# Patient Record
Sex: Female | Born: 1955 | Race: Black or African American | Hispanic: No | Smoking: Current every day smoker
Health system: Southern US, Community
[De-identification: ages and names within clinical notes are randomized; demographics above are authoritative.]

## PROBLEM LIST (undated history)

## (undated) DIAGNOSIS — I10 Essential (primary) hypertension: Secondary | ICD-10-CM

## (undated) DIAGNOSIS — K219 Gastro-esophageal reflux disease without esophagitis: Secondary | ICD-10-CM

## (undated) DIAGNOSIS — E785 Hyperlipidemia, unspecified: Secondary | ICD-10-CM

## (undated) DIAGNOSIS — J45909 Unspecified asthma, uncomplicated: Secondary | ICD-10-CM

## (undated) DIAGNOSIS — R7303 Prediabetes: Secondary | ICD-10-CM

## (undated) DIAGNOSIS — G4700 Insomnia, unspecified: Secondary | ICD-10-CM

## (undated) HISTORY — DX: Hyperlipidemia, unspecified: E78.5

## (undated) HISTORY — DX: Insomnia, unspecified: G47.00

## (undated) HISTORY — DX: Essential (primary) hypertension: I10

---

## 1979-02-13 HISTORY — PX: OTHER SURGICAL HISTORY: SHX169

## 2004-05-19 ENCOUNTER — Emergency Department (HOSPITAL_COMMUNITY): Admission: EM | Admit: 2004-05-19 | Discharge: 2004-05-19 | Payer: Self-pay | Admitting: Emergency Medicine

## 2005-12-06 ENCOUNTER — Emergency Department (HOSPITAL_COMMUNITY): Admission: EM | Admit: 2005-12-06 | Discharge: 2005-12-06 | Payer: Self-pay | Admitting: Emergency Medicine

## 2005-12-07 ENCOUNTER — Emergency Department (HOSPITAL_COMMUNITY): Admission: EM | Admit: 2005-12-07 | Discharge: 2005-12-07 | Payer: Self-pay | Admitting: Emergency Medicine

## 2009-04-10 ENCOUNTER — Emergency Department (HOSPITAL_COMMUNITY): Admission: EM | Admit: 2009-04-10 | Discharge: 2009-04-10 | Payer: Self-pay | Admitting: Emergency Medicine

## 2009-09-22 ENCOUNTER — Emergency Department (HOSPITAL_COMMUNITY): Admission: EM | Admit: 2009-09-22 | Discharge: 2009-09-22 | Payer: Self-pay | Admitting: Emergency Medicine

## 2011-09-19 ENCOUNTER — Telehealth: Payer: Self-pay

## 2019-02-23 ENCOUNTER — Other Ambulatory Visit: Payer: Self-pay

## 2019-02-23 ENCOUNTER — Emergency Department (HOSPITAL_COMMUNITY): Payer: Self-pay

## 2019-02-23 ENCOUNTER — Emergency Department (HOSPITAL_COMMUNITY)
Admission: EM | Admit: 2019-02-23 | Discharge: 2019-02-23 | Disposition: A | Discharge status: Home / Self Care | Payer: Self-pay | Attending: Emergency Medicine | Admitting: Emergency Medicine

## 2019-02-23 ENCOUNTER — Encounter (HOSPITAL_COMMUNITY): Payer: Self-pay | Admitting: *Deleted

## 2019-02-23 DIAGNOSIS — Y999 Unspecified external cause status: Secondary | ICD-10-CM | POA: Insufficient documentation

## 2019-02-23 DIAGNOSIS — F17210 Nicotine dependence, cigarettes, uncomplicated: Secondary | ICD-10-CM | POA: Insufficient documentation

## 2019-02-23 DIAGNOSIS — S42031A Displaced fracture of lateral end of right clavicle, initial encounter for closed fracture: Secondary | ICD-10-CM | POA: Insufficient documentation

## 2019-02-23 DIAGNOSIS — J45909 Unspecified asthma, uncomplicated: Secondary | ICD-10-CM | POA: Insufficient documentation

## 2019-02-23 DIAGNOSIS — Y939 Activity, unspecified: Secondary | ICD-10-CM | POA: Insufficient documentation

## 2019-02-23 DIAGNOSIS — Y929 Unspecified place or not applicable: Secondary | ICD-10-CM | POA: Insufficient documentation

## 2019-02-23 DIAGNOSIS — W19XXXA Unspecified fall, initial encounter: Secondary | ICD-10-CM | POA: Insufficient documentation

## 2019-02-23 HISTORY — DX: Unspecified asthma, uncomplicated: J45.909

## 2019-02-23 MED ORDER — ACETAMINOPHEN 500 MG PO TABS
1000.0000 mg | ORAL_TABLET | Freq: Once | ORAL | Status: AC
  Administered 2019-02-23: 1000 mg via ORAL
  Filled 2019-02-23: qty 2

## 2019-02-23 NOTE — ED Provider Notes (Signed)
[facility] EMERGENCY DEPARTMENT Provider Note   CSN: [id number] Arrival date & time: 02/23/19  1022     History Chief Complaint  Kristine [name] presents with  . Shoulder Pain    TAFFIE [name] is a 64 y.o. female.  Kristine [name] is a 64 y.o. female with history of asthma, who presents to the ED for evaluation of right shoulder pain.  Kristine [name] reports pain began on New Year's Eve after Kristine [name] had a little bit too much to drink and fell landing on her right shoulder.  Kristine [name] reports pain to the bony prominence over the [facility] joint.  Kristine [name] reports initially pain was more severe and now it is more mild and comes and goes.  Kristine [name] has been able to manage it with Tylenol and Aspercreme but since pain continued Kristine [name] came in for evaluation.  Kristine [name] has not noted any swelling or discoloration.  Kristine [name] denies numbness tingling or weakness in the right arm.  Kristine [name] denies any other injuries from the fall.  No previous surgery or injury to the right shoulder.  No other aggravating relieving factors.        Past Medical History:  Diagnosis Date  . Asthma     There are no problems to display for this Kristine [name].   History reviewed. No pertinent surgical history.   OB History   No obstetric history on file.     No family history on file.  Social History   Tobacco Use  . Smoking status: Current Every Day Smoker    Packs/day: 1.00    Types: Cigarettes  . Smokeless tobacco: Never Used  Substance Use Topics  . Alcohol use: Yes    Comment: once or twice weekly   . Drug use: Never    Home Medications Prior to Admission medications   Not on File    Allergies    Penicillins and Sulfa antibiotics  Review of Systems   Review of Systems  Constitutional: Negative for chills and fever.  Musculoskeletal: Positive for arthralgias. Negative for joint swelling.  Skin: Negative for color change and rash.  Neurological: Negative for weakness and numbness.    Physical Exam Updated Vital Signs BP (!) 158/89    Pulse 81   Temp 98.6 F (37 C) (Oral)   Resp 18   Ht 5\' 2"  (1.575 m)   Wt 54.4 kg   SpO2 100%   BMI 21.95 kg/m   Physical Exam Vitals and nursing note reviewed.  Constitutional:      General: Kristine [name] is not in acute distress.    Appearance: Normal appearance. Kristine [name] is well-developed and normal weight. Kristine [name] is not diaphoretic.  HENT:     Head: Normocephalic and atraumatic.  Eyes:     General:        Right eye: No discharge.        Left eye: No discharge.  Pulmonary:     Effort: Pulmonary effort is normal. No respiratory distress.  Musculoskeletal:     Comments: Tenderness to palpation over the right [facility] joint with some palpable deformity, no swelling, skin breakdown or tenting, no discoloration.  Kristine [name] has full range of motion of the shoulder with minimal discomfort.  Distal pulses 2+, 5/5 strength and normal sensation.  All compartments soft  Skin:    General: Skin is warm and dry.  Neurological:     Mental Status: Kristine [name] is alert.     Coordination: Coordination normal.     Comments: Speech is clear, able to follow commands  Moves extremities without ataxia, coordination intact  Psychiatric:        Mood and Affect: Mood normal.        Behavior: Behavior normal.     ED Results / Procedures / Treatments   Labs (all labs ordered are listed, but only abnormal results are displayed) Labs Reviewed - No data to display  EKG None  Radiology DG Shoulder Right  Result Date: 02/23/2019 CLINICAL DATA:  Right shoulder pain since the Kristine [name] suffered a fall the night of 02/12/2019. Initial encounter. EXAM: RIGHT SHOULDER - 2+ VIEW COMPARISON:  None. FINDINGS: The Kristine [name] has an acute fracture of the distal clavicle approximately 1 cm medial to the acromioclavicular joint. The fracture is mildly comminuted with 2 shaft widths inferior displacement of the main distal fragment. The acromioclavicular joint is intact. No other abnormality is identified. IMPRESSION: Acute distal clavicle fracture  as described. Electronically Signed   By: Inge Rise M.D.   On: 02/23/2019 12:07    Procedures Procedures (including critical care time)  Medications Ordered in ED Medications  acetaminophen (TYLENOL) tablet 1,000 mg (has no administration in time range)    ED Course  I have reviewed the triage vital signs and the nursing notes.  Pertinent labs & imaging results that were available during my care of the Kristine [name] were reviewed by me and considered in my medical decision making (see chart for details).  Clinical Course as of Feb 23 1323  Mon Feb 23, 2019  1207 Shoulder x-ray shows distal clavicle fracture that is comminuted with inferior displacement of 2 shaft widths  DG Shoulder Right [KF]  1244 Case discussed with Dr. Lucia Gaskins with [facility] who recommends putting Kristine [name] in a sling, Kristine [name] should be seen later this week in the office.  Discussed this with Kristine [name] who is in agreement.   [KF]    Clinical Course User Index [KF] Kristine [name]   MDM Rules/Calculators/A&P                      64 year old female found to have a right distal clavicle fracture after a fall 11 days ago, pain today is mild, there is displacement noted on the x-ray Kristine [name] has no skin tenting or breakdown  The right arm is neurovascularly intact.  Kristine [name] will be placed in a sling and discharged home with outpatient orthopedic follow-up.  I discussed case with Dr. Lucia Gaskins with [facility], who can see the Kristine [name] later this week for follow-up, Kristine [name] expresses concerns regarding transportation I have also given her information for Dr. Aline Brochure with orthopedics here locally in [place].  Pain is currently well managed with Tylenol and Aspercreme, encouraged her to use ice as well.  Final Clinical Impression(s) / ED Diagnoses Final diagnoses:  Closed displaced fracture of acromial end of right clavicle, initial encounter    Rx / DC Orders ED Discharge Orders    None         Kristine [name] 02/23/19 1338    Noemi Chapel, MD 02/23/19 [phone]

## 2019-02-23 NOTE — ED Triage Notes (Signed)
Pt c/o right shoulder pain after fall on New Year's Eve. Denies LOC.

## 2019-02-23 NOTE — Discharge Instructions (Signed)
You have a fracture to your collarbone, please remain in sling, you will need to follow-up with orthopedics, I discussed your case with Dr. Susa Simmonds with orthopedics in [place] and he will work on getting you into be seen later this week but if transportation is an issue you can also call Dr. Mort Sawyers office here in [place] to schedule follow-up appointment.  Continue using Tylenol and ice for pain management as this is been working well for you.  Return to the ED for significantly worsening pain, numbness or weakness in the arm, discoloration in the skin over your shoulder or any other new or concerning symptoms.

## 2019-02-26 ENCOUNTER — Encounter: Payer: Self-pay | Admitting: Orthopaedic Surgery

## 2019-02-26 ENCOUNTER — Other Ambulatory Visit: Payer: Self-pay

## 2019-02-26 ENCOUNTER — Ambulatory Visit (INDEPENDENT_AMBULATORY_CARE_PROVIDER_SITE_OTHER): Payer: Self-pay | Admitting: Orthopaedic Surgery

## 2019-02-26 VITALS — BP 162/127 | HR 69 | Temp 97.2°F | Ht 62.0 in | Wt 114.4 lb

## 2019-02-26 DIAGNOSIS — S42031A Displaced fracture of lateral end of right clavicle, initial encounter for closed fracture: Secondary | ICD-10-CM

## 2019-02-26 DIAGNOSIS — W01198A Fall on same level from slipping, tripping and stumbling with subsequent striking against other object, initial encounter: Secondary | ICD-10-CM

## 2019-02-26 NOTE — Progress Notes (Signed)
Subjective:    Patient ID: Kristine [name], female    DOB: [birth year], 64 y.o.   MRN: [id number]  HPI She slipped and fell against a door frame and hurt her right clavicle 02-23-2019.  She was seen in the ER.  X-rays were done.  Report shows: FINDINGS: The patient has an acute fracture of the distal clavicle approximately 1 cm medial to the acromioclavicular joint. The fracture is mildly comminuted with 2 shaft widths inferior displacement of the main distal fragment. The acromioclavicular joint is intact. [name] other abnormality is identified.  IMPRESSION: Acute distal clavicle fracture as described.  I have explained findings to her.  A new sling given. I have independently reviewed her x-rays and agree with the report.  I have reviewed the ER records.  She is controlled with Tylenol for pain.  She has [name] other injury.   Review of Systems  Constitutional: Positive for activity change.  Respiratory: Positive for shortness of breath.   Musculoskeletal: Positive for arthralgias and joint swelling.  All other systems reviewed and are negative.  For Review of Systems, all other systems reviewed and are negative.  The following is a summary of the past history medically, past history surgically, known current medicines, social history and family history.  This information is gathered electronically by the computer from prior information and documentation.  I review this each visit and have found including this information at this point in the chart is beneficial and informative.   Past Medical History:  Diagnosis Date  . Asthma     Past Surgical History:  Procedure Laterality Date  . tubilagation  1981    Current Outpatient Medications on File Prior to Visit  Medication Sig Dispense Refill  . acetaminophen (TYLENOL) 500 MG tablet Take 500 mg by mouth every 6 (six) hours as needed.    Marland Kitchen ibuprofen (ADVIL) 200 MG tablet Take 200 mg by mouth every 6 (six) hours as needed.     [name]  current facility-administered medications on file prior to visit.    Social History   Socioeconomic History  . Marital status: Widowed    Spouse name: Not on file  . Number of children: 3  . Years of education: Not on file  . Highest education level: Not on file  Occupational History  . Not on file  Tobacco Use  . Smoking status: Current Every Day Smoker    Packs/day: 1.00    Types: Cigarettes  . Smokeless tobacco: Never Used  Substance and Sexual Activity  . Alcohol use: Yes    Comment: once or twice weekly   . Drug use: Never  . Sexual activity: Not Currently    Birth control/protection: Post-menopausal  Other Topics Concern  . Not on file  Social History Narrative  . Not on file   Social Determinants of Health   Financial Resource Strain:   . Difficulty of Paying Living Expenses: Not on file  Food Insecurity:   . Worried About Programme researcher, broadcasting/film/video in the Last Year: Not on file  . Ran Out of Food in the Last Year: Not on file  Transportation Needs:   . Lack of Transportation (Medical): Not on file  . Lack of Transportation (Non-Medical): Not on file  Physical Activity:   . Days of Exercise per Week: Not on file  . Minutes of Exercise per Session: Not on file  Stress:   . Feeling of Stress : Not on file  Social Connections:   .  Frequency of Communication with Friends and Family: Not on file  . Frequency of Social Gatherings with Friends and Family: Not on file  . Attends Religious Services: Not on file  . Active Member of Clubs or Organizations: Not on file  . Attends Archivist Meetings: Not on file  . Marital Status: Not on file  Intimate Partner Violence:   . Fear of Current or Ex-Partner: Not on file  . Emotionally Abused: Not on file  . Physically Abused: Not on file  . Sexually Abused: Not on file    Family History  Problem Relation Age of Onset  . Diabetes type II Mother   . Hypertension Mother   . Cancer Sister   . Hypertension Brother      BP (!) 162/127   Pulse 69   Temp (!) 97.2 F (36.2 C)   Ht 5\' 2"  (1.575 m)   Wt 114 lb 6 oz (51.9 kg)   BMI 20.92 kg/m   Body mass index is 20.92 kg/m.     Objective:   Physical Exam Vitals and nursing note reviewed.  Constitutional:      Appearance: She is well-developed.  HENT:     Head: Normocephalic and atraumatic.  Eyes:     Conjunctiva/sclera: Conjunctivae normal.     Pupils: Pupils are equal, round, and reactive to light.  Cardiovascular:     Rate and Rhythm: Normal rate and regular rhythm.  Pulmonary:     Effort: Pulmonary effort is normal.  Abdominal:     Palpations: Abdomen is soft.  Musculoskeletal:       Arms:     Cervical back: Normal range of motion and neck supple.  Skin:    General: Skin is warm and dry.  Neurological:     Mental Status: She is alert and oriented to person, place, and time.     Cranial Nerves: [name] cranial nerve deficit.     Motor: [name] abnormal muscle tone.     Coordination: Coordination normal.     Deep Tendon Reflexes: Reflexes are normal and symmetric. Reflexes normal.  Psychiatric:        Behavior: Behavior normal.        Thought Content: Thought content normal.        Judgment: Judgment normal.           Assessment & Plan:   Encounter Diagnosis  Name Primary?  . Closed displaced fracture of acromial end of right clavicle, initial encounter Yes   New sling given.  Return in two weeks.  X-rays of the right clavicle then.  Call if any problem.  Precautions discussed.   Electronically Signed Sanjuana Kava, MD 1/14/20219:08 AM

## 2019-02-26 NOTE — Patient Instructions (Signed)

## 2019-03-12 ENCOUNTER — Ambulatory Visit: Payer: Self-pay | Admitting: Orthopaedic Surgery

## 2019-03-17 ENCOUNTER — Other Ambulatory Visit: Payer: Self-pay

## 2019-03-17 ENCOUNTER — Ambulatory Visit (INDEPENDENT_AMBULATORY_CARE_PROVIDER_SITE_OTHER): Payer: Self-pay | Admitting: Orthopaedic Surgery

## 2019-03-17 ENCOUNTER — Ambulatory Visit: Payer: Medicaid Other

## 2019-03-17 ENCOUNTER — Encounter: Payer: Self-pay | Admitting: Orthopaedic Surgery

## 2019-03-17 DIAGNOSIS — S42031D Displaced fracture of lateral end of right clavicle, subsequent encounter for fracture with routine healing: Secondary | ICD-10-CM

## 2019-03-17 NOTE — Progress Notes (Signed)
My shoulder is not as sore  She has been using her sling.  She has less pain of the right distal clavicle.  NV intact.  X-rays were done of the right clavicle, reported separately.  Encounter Diagnosis  Name Primary?  . Closed displaced fracture of acromial end of right clavicle with routine healing, subsequent encounter Yes   Return in three weeks.  Come out of sling.  Call if any problem.  Precautions discussed.   X-rays on return.  Electronically Signed Darreld Mclean, MD 2/2/202110:18 AM

## 2019-04-07 ENCOUNTER — Other Ambulatory Visit: Payer: Self-pay

## 2019-04-07 ENCOUNTER — Encounter: Payer: Self-pay | Admitting: Orthopaedic Surgery

## 2019-04-07 ENCOUNTER — Ambulatory Visit (INDEPENDENT_AMBULATORY_CARE_PROVIDER_SITE_OTHER): Payer: Self-pay | Admitting: Orthopaedic Surgery

## 2019-04-07 ENCOUNTER — Ambulatory Visit: Payer: Medicaid Other

## 2019-04-07 VITALS — Ht 59.0 in | Wt 115.0 lb

## 2019-04-07 DIAGNOSIS — S42031D Displaced fracture of lateral end of right clavicle, subsequent encounter for fracture with routine healing: Secondary | ICD-10-CM

## 2019-04-07 NOTE — Progress Notes (Signed)
My shoulder does not hurt  She has full ROM of the right shoulder and no pain.  She has prominence of the distal clavicle.  I told her it could be partially excised if it hurts in the future.  X-rays were done of the right clavicle, reported separately.  Encounter Diagnosis  Name Primary?  . Closed displaced fracture of acromial end of right clavicle with routine healing, subsequent encounter Yes   Discharge.  Call if any problem.  Precautions discussed.   Electronically Signed Darreld Mclean, MD 2/23/202110:15 AM

## 2020-09-19 IMAGING — DX DG SHOULDER 2+V*R*
3 series · 3 of 3 positions shown · non-contrast
Comparison: None.

CLINICAL DATA: Right shoulder pain since the patient suffered a
fall the night of 02/12/2019. Initial encounter.

EXAM:
RIGHT SHOULDER - 2+ VIEW

[shoulder grashey]
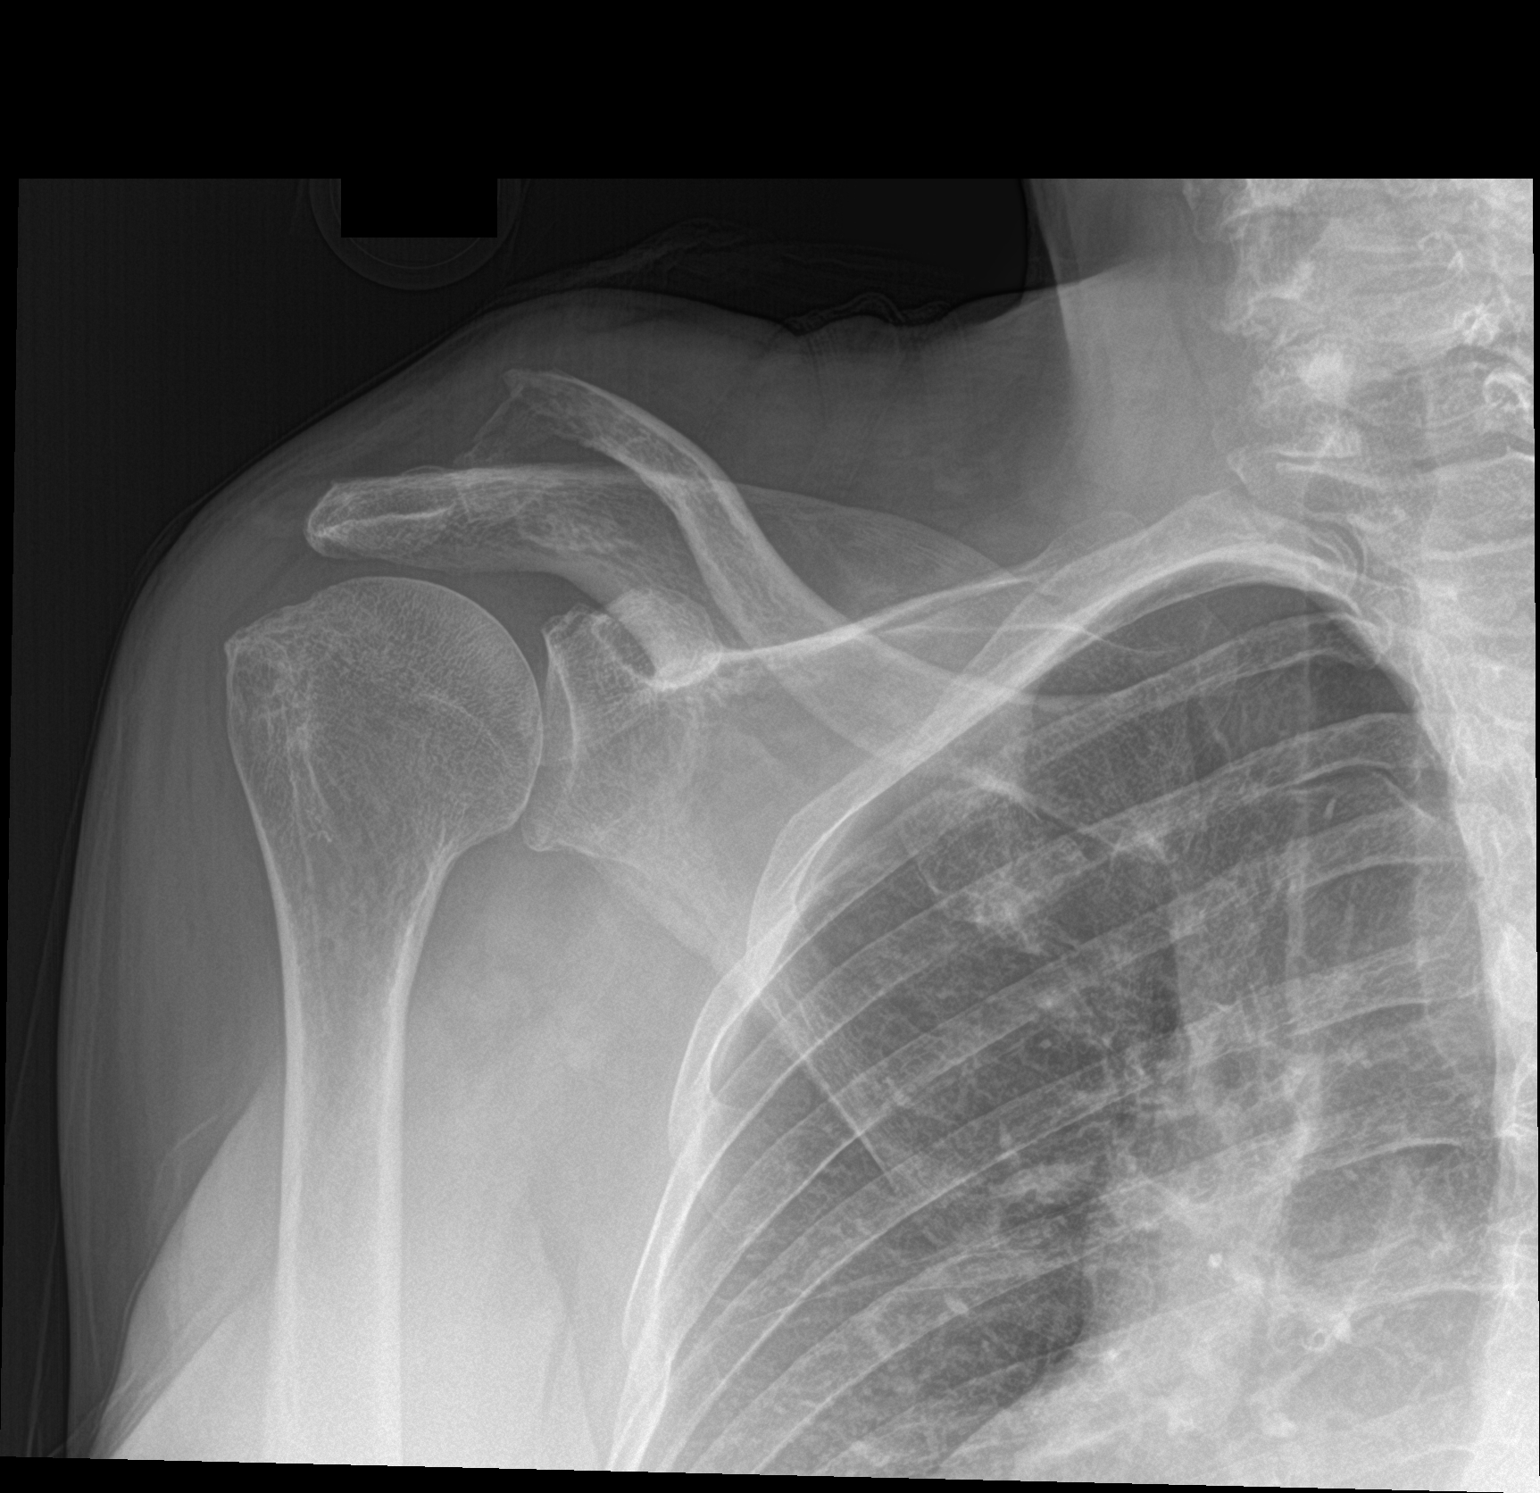

[shoulder y view]
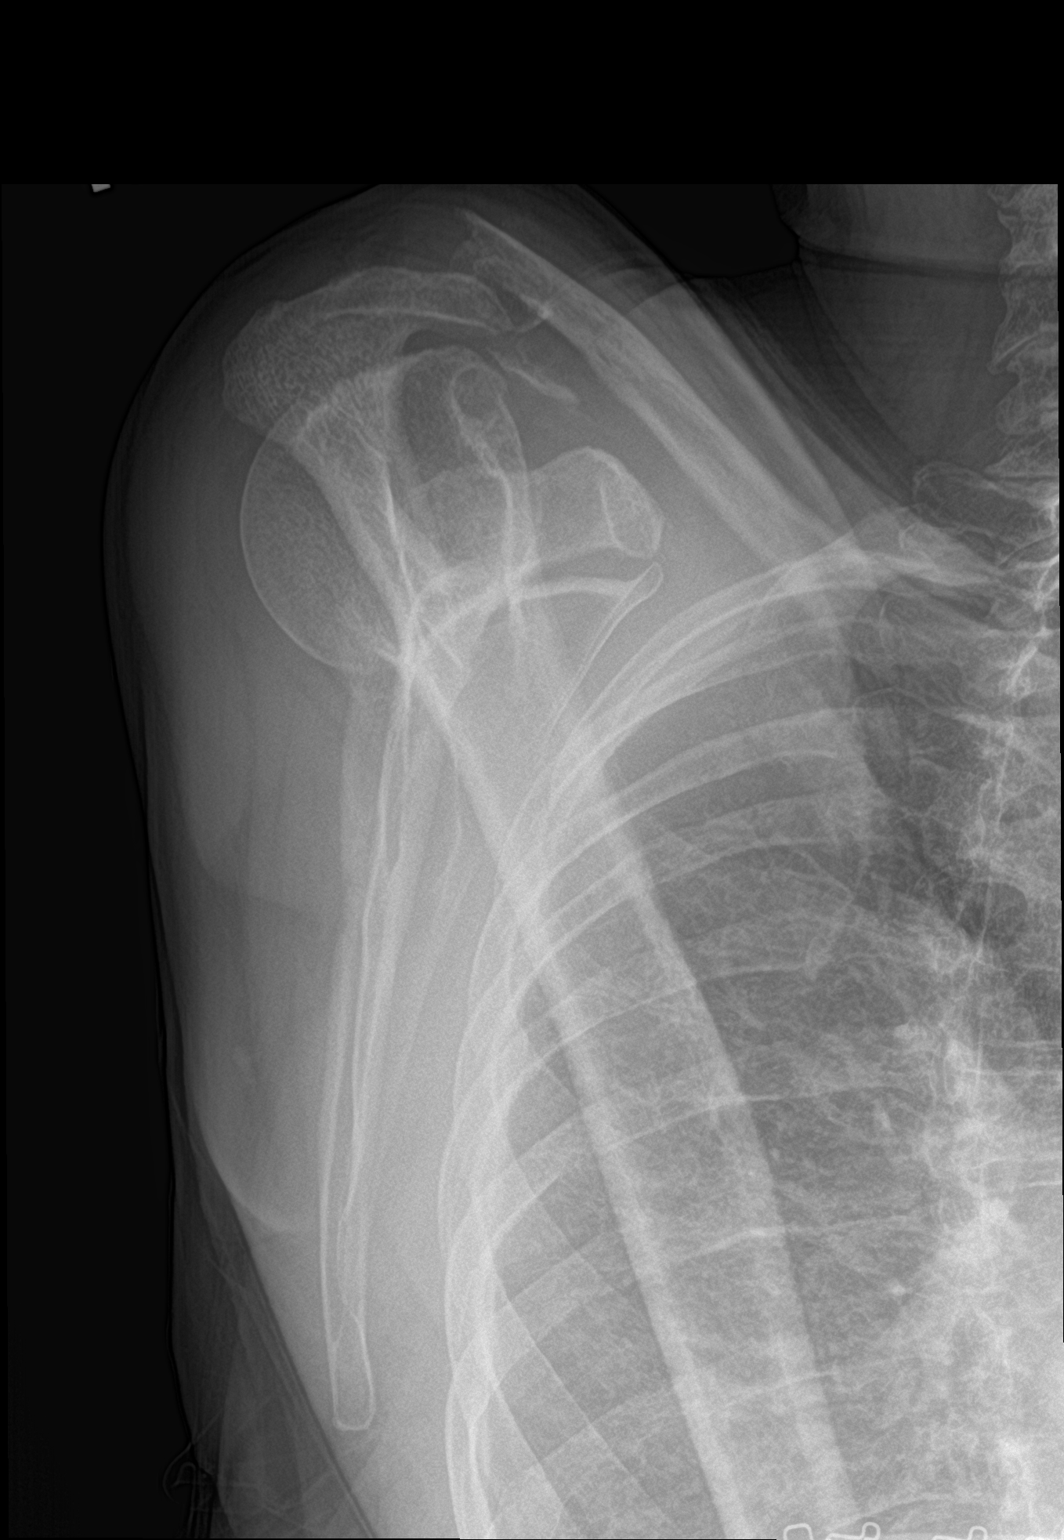

[shoulder axillary]
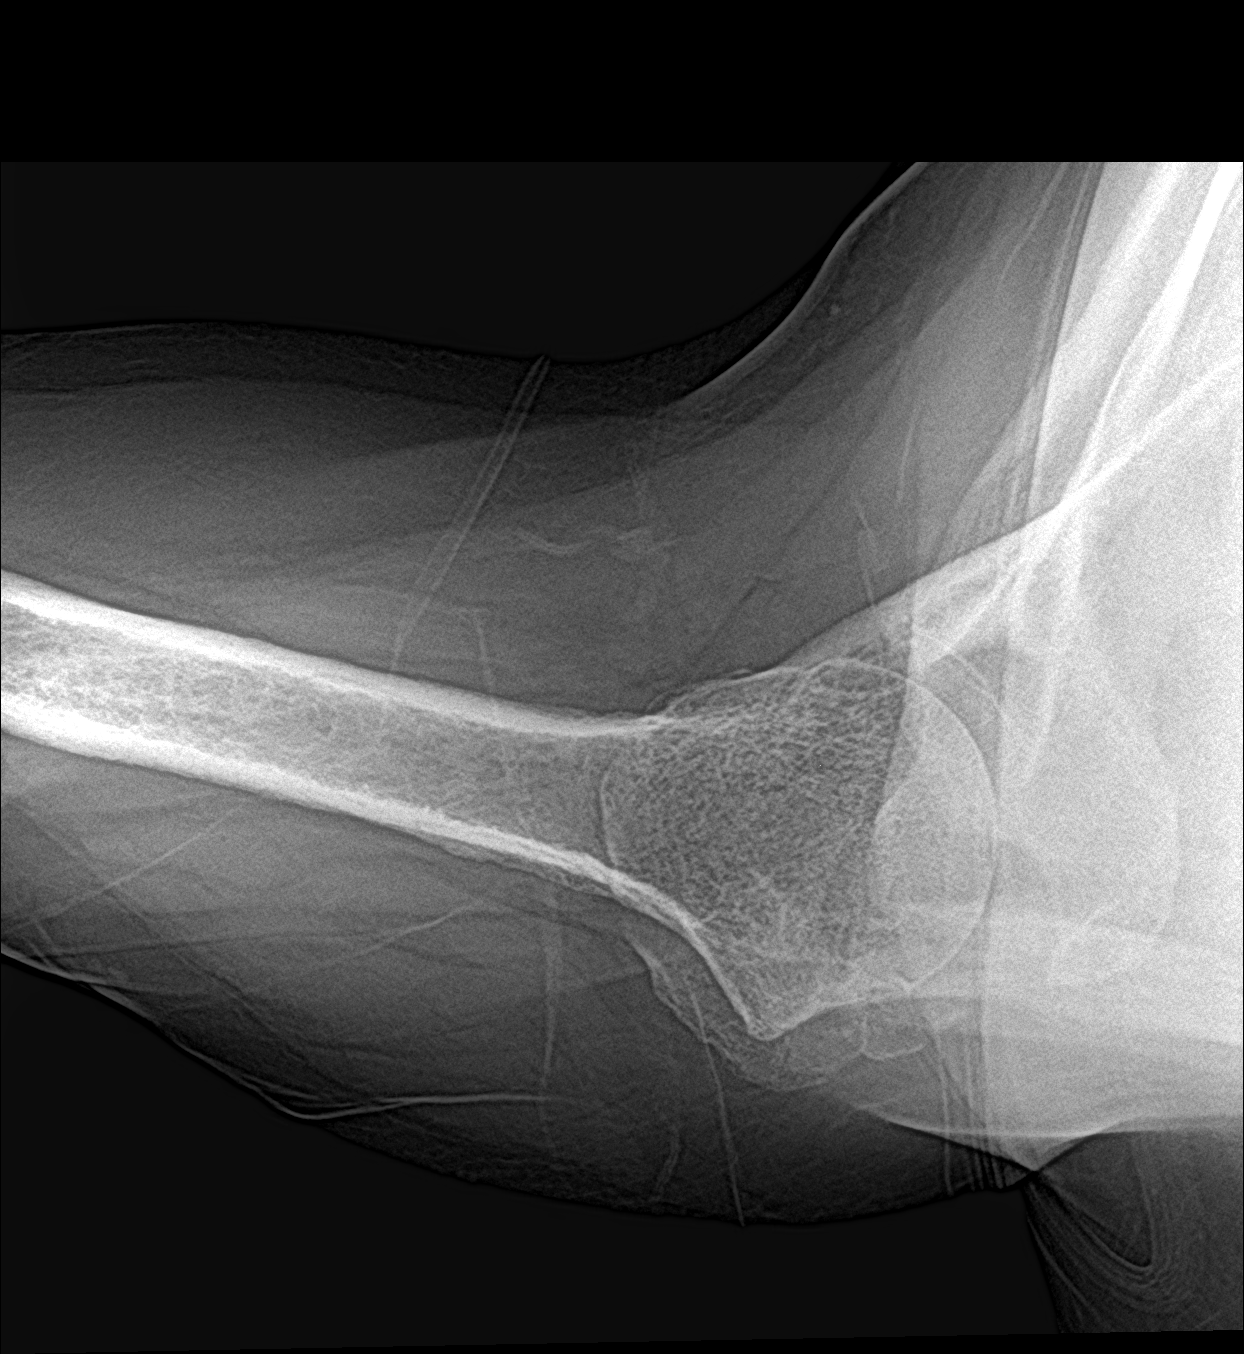

[3 of 3 positions shown; findings below may reference images not displayed]

FINDINGS: The patient has an acute fracture of the distal clavicle
approximately 1 cm medial to the acromioclavicular joint. The
fracture is mildly comminuted with 2 shaft widths inferior
displacement of the main distal fragment. The acromioclavicular
joint is intact. No other abnormality is identified.
IMPRESSION: Acute distal clavicle fracture as described.

## 2022-02-02 ENCOUNTER — Emergency Department (HOSPITAL_COMMUNITY)
Admission: EM | Admit: 2022-02-02 | Discharge: 2022-02-02 | Disposition: A | Discharge status: Home / Self Care | Payer: BLUE CROSS/BLUE SHIELD | Attending: Emergency Medicine | Admitting: Emergency Medicine

## 2022-02-02 ENCOUNTER — Encounter (HOSPITAL_COMMUNITY): Payer: Self-pay | Admitting: Emergency Medicine

## 2022-02-02 ENCOUNTER — Other Ambulatory Visit: Payer: Self-pay

## 2022-02-02 ENCOUNTER — Emergency Department (HOSPITAL_COMMUNITY): Payer: BLUE CROSS/BLUE SHIELD

## 2022-02-02 DIAGNOSIS — Z20822 Contact with and (suspected) exposure to covid-19: Secondary | ICD-10-CM | POA: Diagnosis not present

## 2022-02-02 DIAGNOSIS — E876 Hypokalemia: Secondary | ICD-10-CM | POA: Diagnosis not present

## 2022-02-02 DIAGNOSIS — J45901 Unspecified asthma with (acute) exacerbation: Secondary | ICD-10-CM | POA: Diagnosis not present

## 2022-02-02 DIAGNOSIS — F17200 Nicotine dependence, unspecified, uncomplicated: Secondary | ICD-10-CM | POA: Diagnosis not present

## 2022-02-02 DIAGNOSIS — R0602 Shortness of breath: Secondary | ICD-10-CM | POA: Diagnosis present

## 2022-02-02 LAB — CBC WITH DIFFERENTIAL/PLATELET
Abs Immature Granulocytes: 0.04 10*3/uL (ref 0.00–0.07)
Basophils Absolute: 0.1 10*3/uL (ref 0.0–0.1)
Basophils Relative: 0 %
Eosinophils Absolute: 0 10*3/uL (ref 0.0–0.5)
Eosinophils Relative: 0 %
HCT: 38.2 % (ref 36.0–46.0)
Hemoglobin: 12.7 g/dL (ref 12.0–15.0)
Immature Granulocytes: 0 %
Lymphocytes Relative: 15 %
Lymphs Abs: 1.7 10*3/uL (ref 0.7–4.0)
MCH: 31.2 pg (ref 26.0–34.0)
MCHC: 33.2 g/dL (ref 30.0–36.0)
MCV: 93.9 fL (ref 80.0–100.0)
Monocytes Absolute: 1 10*3/uL (ref 0.1–1.0)
Monocytes Relative: 9 %
Neutro Abs: 8.4 10*3/uL — ABNORMAL HIGH (ref 1.7–7.7)
Neutrophils Relative %: 76 %
Platelets: 251 10*3/uL (ref 150–400)
RBC: 4.07 MIL/uL (ref 3.87–5.11)
RDW: 13.9 % (ref 11.5–15.5)
WBC: 11.2 10*3/uL — ABNORMAL HIGH (ref 4.0–10.5)
nRBC: 0 % (ref 0.0–0.2)

## 2022-02-02 LAB — RESP PANEL BY RT-PCR (RSV, FLU A&B, COVID)  RVPGX2
Influenza A by PCR: NEGATIVE
Influenza B by PCR: NEGATIVE
Resp Syncytial Virus by PCR: NEGATIVE
SARS Coronavirus 2 by RT PCR: NEGATIVE

## 2022-02-02 LAB — BASIC METABOLIC PANEL
Anion gap: 10 (ref 5–15)
BUN: 24 mg/dL — ABNORMAL HIGH (ref 8–23)
CO2: 23 mmol/L (ref 22–32)
Calcium: 9.7 mg/dL (ref 8.9–10.3)
Chloride: 101 mmol/L (ref 98–111)
Creatinine, Ser: 1.44 mg/dL — ABNORMAL HIGH (ref 0.44–1.00)
GFR, Estimated: 41 mL/min — ABNORMAL LOW (ref >60)
Glucose, Bld: 140 mg/dL — ABNORMAL HIGH (ref 70–99)
Potassium: 2.8 mmol/L — ABNORMAL LOW (ref 3.5–5.1)
Sodium: 134 mmol/L — ABNORMAL LOW (ref 135–145)

## 2022-02-02 LAB — MAGNESIUM: Magnesium: 1.7 mg/dL (ref 1.7–2.4)

## 2022-02-02 MED ORDER — POTASSIUM CHLORIDE CRYS ER 20 MEQ PO TBCR
40.0000 meq | EXTENDED_RELEASE_TABLET | Freq: Once | ORAL | Status: AC
  Administered 2022-02-02: 40 meq via ORAL
  Filled 2022-02-02: qty 2

## 2022-02-02 MED ORDER — PREDNISONE 50 MG PO TABS: 50.0000 mg | ORAL_TABLET | Freq: Every day | ORAL | 0 refills | Status: AC

## 2022-02-02 MED ORDER — AZITHROMYCIN 250 MG PO TABS: 250.0000 mg | ORAL_TABLET | Freq: Every day | ORAL | 0 refills | Status: DC

## 2022-02-02 MED ORDER — ALBUTEROL SULFATE HFA 108 (90 BASE) MCG/ACT IN AERS: 2.0000 | INHALATION_SPRAY | RESPIRATORY_TRACT | 0 refills | Status: DC | PRN

## 2022-02-02 MED ORDER — POTASSIUM CHLORIDE ER 10 MEQ PO TBCR: 10.0000 meq | EXTENDED_RELEASE_TABLET | Freq: Every day | ORAL | 0 refills | Status: DC

## 2022-02-02 MED ORDER — IPRATROPIUM BROMIDE 0.02 % IN SOLN
0.5000 mg | Freq: Once | RESPIRATORY_TRACT | Status: AC
  Administered 2022-02-02: 0.5 mg via RESPIRATORY_TRACT
  Filled 2022-02-02: qty 2.5

## 2022-02-02 MED ORDER — PREDNISONE 20 MG PO TABS: 40.0000 mg | ORAL_TABLET | Freq: Once | ORAL | Status: DC

## 2022-02-02 MED ORDER — POTASSIUM CHLORIDE 20 MEQ PO PACK
40.0000 meq | PACK | Freq: Once | ORAL | Status: AC
  Administered 2022-02-02: 40 meq via ORAL
  Filled 2022-02-02: qty 2

## 2022-02-02 MED ORDER — METHYLPREDNISOLONE SODIUM SUCC 125 MG IJ SOLR
125.0000 mg | Freq: Once | INTRAMUSCULAR | Status: AC
  Administered 2022-02-02: 125 mg via INTRAVENOUS
  Filled 2022-02-02: qty 2

## 2022-02-02 MED ORDER — ALBUTEROL (5 MG/ML) CONTINUOUS INHALATION SOLN
10.0000 mg/h | INHALATION_SOLUTION | Freq: Once | RESPIRATORY_TRACT | Status: AC
  Administered 2022-02-02: 10 mg/h via RESPIRATORY_TRACT
  Filled 2022-02-02: qty 20

## 2022-02-02 NOTE — Discharge Instructions (Addendum)
Seen today for difficulty breathing, you are likely having an exacerbation of your asthma.  You are being given steroids and an inhaler to use, also being given antibiotics because you smoke and may have some COPD as well and are having green sputum when he coughs.  Make sure you take all the medicines, call to make an appointment as soon as possible with your primary care doctor.  Return to the ER if you have any new or worsening symptoms. [facility] Primary Care Doctor List  Syliva Overman, MD. Specialty: [facility] Medicine Contact information: [address]  [place] [address]  [phone]   Lilyan Punt, MD. Specialty: [facility] Medicine Contact information: [address]  [place] [address]  [phone]   Avon Gully, MD Specialty: Internal Medicine Contact information: [address] [place] [address]  [phone]   Catalina Pizza, MD. Specialty: Internal Medicine Contact information: [address]  [place] [address]  [phone]    [facility] (Dr. Selena Batten) Specialty: Family Medicine Contact information: [address]  [place] [address]  [phone]   John Giovanni, MD. Specialty: [facility] Medicine Contact information: [address]  [address]  [place] [address]  [phone]   Carylon Perches, MD. Specialty: Internal Medicine Contact information: [address]  [address]  [place] [address]  [phone]    [facility]  [address] [place], [place] [address] [phone]  Services [facility] offers a variety of basic health services.  Services include but are not limited to: Blood pressure checks  Heart rate checks  Blood sugar checks  Urine analysis  Rapid strep tests  Pregnancy tests.  Health education and referrals  People needing more complex services will be directed to a physician online. Using these virtual  visits, doctors can evaluate and prescribe medicine and treatments. There will be no medication on-site, though [facility] will help patients fill their prescriptions at little to no cost.   For More information please go to: [email or web address]

## 2022-02-02 NOTE — ED Provider Notes (Signed)
[facility] EMERGENCY DEPARTMENT Provider Note   CSN: [id number] Arrival date & time: 02/02/22  0945     History  Chief Complaint  Patient presents with   Shortness of Breath   Cough    Kristine [name] is a 66 y.o. female.  This is a 66 year old female with history of asthma who is a current smoker.  She presents to ER complaining of 2-day history of cough with green sputum and shortness of breath.  She does not have any inhalers or nebulizer treatments at home but states she breathes into a paper bag when she has some trouble breathing.  She denies any hemoptysis.  She states she only has pain in her chest with coughing, no abdominal pain nausea vomiting.  No syncope.  She denies any fevers at home.   Shortness of Breath Associated symptoms: cough   Cough Associated symptoms: shortness of breath        Home Medications Prior to Admission medications   Medication Sig Start Date End Date Taking? Authorizing Provider  acetaminophen (TYLENOL) 500 MG tablet Take 500 mg by mouth every 6 (six) hours as needed.    [provider]  ibuprofen (ADVIL) 200 MG tablet Take 200 mg by mouth every 6 (six) hours as needed.    [provider]      Allergies    Penicillins and Sulfa antibiotics    Review of Systems   Review of Systems  Respiratory:  Positive for cough and shortness of breath.     Physical Exam Updated Vital Signs BP 122/86 (BP Location: Right Arm)   Pulse (!) 104   Temp 99.5 F (37.5 C) (Oral)   Resp 16   SpO2 100%  Physical Exam Vitals and nursing note reviewed.  Constitutional:      General: She is not in acute distress.    Appearance: She is well-developed.  HENT:     Head: Normocephalic and atraumatic.  Eyes:     Conjunctiva/sclera: Conjunctivae normal.  Cardiovascular:     Rate and Rhythm: Normal rate and regular rhythm.     Heart sounds: No murmur heard. Pulmonary:     Effort: Pulmonary effort is normal. No respiratory distress.      Breath sounds: Decreased breath sounds present. No wheezing, rhonchi or rales.  Chest:     Chest wall: No tenderness or crepitus.  Abdominal:     Palpations: Abdomen is soft.     Tenderness: There is no abdominal tenderness.  Musculoskeletal:        General: No swelling.     Cervical back: Neck supple.  Skin:    General: Skin is warm and dry.     Capillary Refill: Capillary refill takes less than 2 seconds.  Neurological:     General: No focal deficit present.     Mental Status: She is alert and oriented to person, place, and time.  Psychiatric:        Mood and Affect: Mood normal.     ED Results / Procedures / Treatments   Labs (all labs ordered are listed, but only abnormal results are displayed) Labs Reviewed  RESP PANEL BY RT-PCR (RSV, FLU A&B, COVID)  [id number]  CBC WITH DIFFERENTIAL/PLATELET  BASIC METABOLIC PANEL    EKG None  Radiology No results found.  Procedures Procedures    Medications Ordered in ED Medications  albuterol (PROVENTIL,VENTOLIN) solution continuous neb (has no administration in time range)  ipratropium (ATROVENT) nebulizer solution 0.5 mg (has no administration  in time range)  methylPREDNISolone sodium succinate (SOLU-MEDROL) 125 mg/2 mL injection 125 mg (has no administration in time range)    ED Course/ Medical Decision Making/ A&P                           Medical Decision Making This patient presents to the ED for concern of Sob and cough, this involves an extensive number of treatment options, and is a complaint that carries with it a high risk of complications and morbidity.  The differential diagnosis includes asthma exacerbation, COPD exacerbation, pneumonia, bronchitis, pulmonary embolism, other   Co morbidities that complicate the patient evaluation  Asthma, tobacco abuse   Additional history obtained:  Additional history obtained from EMR External records from outside source obtained and reviewed including orthopedic  visit for clavicle fracture   Lab Tests:  I Ordered, and personally interpreted labs.  The pertinent results include: CT reassuring, BMP shows hypokalemia which was repleted orally, GFR is 41, no baseline to compare   Imaging Studies ordered:  I ordered imaging studies including chest x-ray I independently visualized and interpreted imaging which showed no pulmonary edema or infiltrate I agree with the radiologist interpretation   Cardiac Monitoring: / EKG:  The patient was maintained on a cardiac monitor.  I personally viewed and interpreted the cardiac monitored which showed an underlying rhythm of: Normal sinus rhythm, tachycardic to 120s while getting albuterol   Problem List / ED Course / Critical interventions / Medication management  Asthma exacerbation: Patient has history of asthma, question possible underlying COPD given patient's smoking history.  She is having cough with purulent green sputum.  She is afebrile, she initially had diffuse decreased breath sounds, her shortness of breath is much better after continuous nebulizer, steroids.  Labs reveal a hypokalemia.  They also reveal GFR of 41.  Unsure of patient's baseline.  Her potassium is repleted orally.  Patient does not have a primary care doctor, plan to give her close outpatient follow-up for recheck of labs.  Will send her home with albuterol, prednisone and azithromycin.  She did not drop below 96 on the ambulation test on room air.  She is feeling better, will have her follow-up with primary care.  Instructed on need for follow-up to recheck her kidney function, recheck her potassium level I ordered medication including albuterol and Atrovent for asthma exacerbation Reevaluation of the patient after these medicines showed that the patient improved I have reviewed the patients home medicines and have made adjustments as needed    Test / Admission - Considered:  Altered D-dimer for PE the patient has no risk  factors, no signs of DVT, her exam and history are consistent with her asthma exacerbation and her symptoms with usual treatment.    Amount and/or Complexity of Data Reviewed Labs: ordered. Radiology: ordered.  Risk Prescription drug management.           Final Clinical Impression(s) / ED Diagnoses Final diagnoses:  None    Rx / DC Orders ED Discharge Orders     None         Josem Kaufmann 02/02/22 1251    Eber Hong, MD 02/05/22 [phone]

## 2022-02-02 NOTE — ED Triage Notes (Signed)
Pt c/o sob x 3 days. Pt c/o cough and stuffy nose. Mild labored breathing in triage. A/o. Ambulatory. C/o mid cp only with couhging

## 2022-02-02 NOTE — ED Notes (Signed)
Pt ambulated in the hallway without any assistance. Pt O2 stats drop to 96% on room air.

## 2022-02-19 ENCOUNTER — Encounter: Payer: Self-pay | Admitting: Family Medicine

## 2022-02-19 ENCOUNTER — Ambulatory Visit: Payer: BLUE CROSS/BLUE SHIELD | Admitting: Family Medicine

## 2022-02-19 VITALS — BP 132/82 | HR 81 | Ht 62.0 in | Wt 125.0 lb

## 2022-02-19 DIAGNOSIS — Z1211 Encounter for screening for malignant neoplasm of colon: Secondary | ICD-10-CM

## 2022-02-19 DIAGNOSIS — E559 Vitamin D deficiency, unspecified: Secondary | ICD-10-CM

## 2022-02-19 DIAGNOSIS — Z114 Encounter for screening for human immunodeficiency virus [HIV]: Secondary | ICD-10-CM

## 2022-02-19 DIAGNOSIS — Z23 Encounter for immunization: Secondary | ICD-10-CM

## 2022-02-19 DIAGNOSIS — Z1159 Encounter for screening for other viral diseases: Secondary | ICD-10-CM

## 2022-02-19 DIAGNOSIS — R7301 Impaired fasting glucose: Secondary | ICD-10-CM

## 2022-02-19 DIAGNOSIS — Z1239 Encounter for other screening for malignant neoplasm of breast: Secondary | ICD-10-CM

## 2022-02-19 DIAGNOSIS — E785 Hyperlipidemia, unspecified: Secondary | ICD-10-CM

## 2022-02-19 DIAGNOSIS — J45909 Unspecified asthma, uncomplicated: Secondary | ICD-10-CM

## 2022-02-19 DIAGNOSIS — I10 Essential (primary) hypertension: Secondary | ICD-10-CM

## 2022-02-19 DIAGNOSIS — E039 Hypothyroidism, unspecified: Secondary | ICD-10-CM

## 2022-02-19 MED ORDER — BUDESONIDE-FORMOTEROL FUMARATE 80-4.5 MCG/ACT IN AERO: 2.0000 | INHALATION_SPRAY | Freq: Two times a day (BID) | RESPIRATORY_TRACT | 3 refills | Status: DC

## 2022-02-19 MED ORDER — ALBUTEROL SULFATE HFA 108 (90 BASE) MCG/ACT IN AERS: 2.0000 | INHALATION_SPRAY | Freq: Four times a day (QID) | RESPIRATORY_TRACT | 2 refills | Status: DC | PRN

## 2022-02-19 NOTE — Progress Notes (Unsigned)
New patient Office Visit  Subjective:     Patient ID: Kristine [name], female    DOB: 1955/02/25, 67 y.o.   MRN: [id number]  Chief Complaint  Patient presents with   Establish Care   Asthma    Kristine [name] 67 year old female presents for evaluation of asthma. Patient's symptoms include dyspnea and non-productive cough. Associated symptoms include dyspnea and headache. The patient has been suffering from these symptoms for approximately since she was child. Symptoms have been stable since their onset. Medications used in the past to treat these symptoms include no prescription medications. Suspected precipitants include upper respiratory infection. Patient is awoken from sleep approximately 1 time per night. Patient has required Emergency Room treatment for these symptoms, and has not required hospitalization. The patient has not been intubated in the past.   Asthma She complains of chest tightness, cough, shortness of breath and wheezing. There is no difficulty breathing or sputum production. This is a chronic problem. The current episode started more than 1 year ago. The problem occurs intermittently. The problem has been waxing and waning. The cough is non-productive. Associated symptoms include dyspnea on exertion and headaches. Pertinent negatives include no fever, rhinorrhea, sneezing, sore throat or trouble swallowing. Her symptoms are aggravated by URI. Her symptoms are alleviated by rest. She reports minimal improvement on treatment. Her past medical history is significant for asthma.     Review of Systems  Constitutional:  Negative for fever.  HENT:  Negative for rhinorrhea, sneezing, sore throat and trouble swallowing.   Respiratory:  Positive for cough, shortness of breath and wheezing. Negative for sputum production.   Cardiovascular:  Positive for dyspnea on exertion.  Neurological:  Positive for headaches.        Objective:    BP 132/82   Pulse 81   Ht 5\' 2"   (1.575 m)   Wt 125 lb (56.7 kg)   SpO2 95%   BMI 22.86 kg/m  BP Readings from Last 3 Encounters:  02/19/22 132/82  02/02/22 135/88  02/26/19 (!) 162/127      Physical Exam Vitals reviewed.  HENT:     Right Ear: Tympanic membrane normal.     Left Ear: Tympanic membrane normal.     Nose: Nose normal. No rhinorrhea.     Mouth/Throat:     Mouth: Mucous membranes are moist.  Eyes:     Extraocular Movements: Extraocular movements intact.     Pupils: Pupils are equal, round, and reactive to light.  Cardiovascular:     Rate and Rhythm: Normal rate and regular rhythm.     Pulses: Normal pulses.     Heart sounds: Normal heart sounds.  Pulmonary:     Breath sounds: Wheezing present.  Musculoskeletal:        General: No swelling or tenderness. Normal range of motion.     Cervical back: Normal range of motion.  Skin:    General: Skin is warm and dry.     Capillary Refill: Capillary refill takes less than 2 seconds.  Neurological:     Mental Status: She is alert and oriented to person, place, and time.     Gait: Gait normal.     Deep Tendon Reflexes: Reflexes normal.     Results for orders placed or performed in visit on 02/19/22  Microalbumin / creatinine urine ratio  Result Value Ref Range   Creatinine, Urine WILL FOLLOW    Microalbumin, Urine WILL FOLLOW    Microalb/Creat Ratio WILL  FOLLOW   Hepatitis C antibody  Result Value Ref Range   Hep C Virus Ab WILL FOLLOW   VITAMIN D 25 Hydroxy (Vit-D Deficiency, Fractures)  Result Value Ref Range   Vit D, 25-Hydroxy 19.3 (L) 30.0 - 100.0 ng/mL  HIV Antibody (routine testing w rflx)  Result Value Ref Range   HIV Screen 4th Generation wRfx Non Reactive Non Reactive  TSH + free T4  Result Value Ref Range   TSH 1.710 0.450 - 4.500 uIU/mL   Free T4 0.85 0.82 - 1.77 ng/dL  Lipid panel  Result Value Ref Range   Cholesterol, Total 220 (H) 100 - 199 mg/dL   Triglycerides 311 (H) 0 - 149 mg/dL   HDL 37 (L) >39 mg/dL   VLDL  Cholesterol Cal 55 (H) 5 - 40 mg/dL   LDL Chol Calc (NIH) 128 (H) 0 - 99 mg/dL   Chol/HDL Ratio 5.9 (H) 0.0 - 4.4 ratio  Hemoglobin A1c  Result Value Ref Range   Hgb A1c MFr Bld 5.9 (H) 4.8 - 5.6 %   Est. average glucose Bld gHb Est-mCnc 123 mg/dL  CMP14+EGFR  Result Value Ref Range   Glucose 85 70 - 99 mg/dL   BUN 13 8 - 27 mg/dL   Creatinine, Ser 0.98 0.57 - 1.00 mg/dL   eGFR 64 >59 mL/min/1.73   BUN/Creatinine Ratio 13 12 - 28   Sodium 138 134 - 144 mmol/L   Potassium 3.8 3.5 - 5.2 mmol/L   Chloride 102 96 - 106 mmol/L   CO2 22 20 - 29 mmol/L   Calcium 10.0 8.7 - 10.3 mg/dL   Total Protein 7.4 6.0 - 8.5 g/dL   Albumin 4.1 3.9 - 4.9 g/dL   Globulin, Total 3.3 1.5 - 4.5 g/dL   Albumin/Globulin Ratio 1.2 1.2 - 2.2   Bilirubin Total 0.4 0.0 - 1.2 mg/dL   Alkaline Phosphatase 87 44 - 121 IU/L   AST 19 0 - 40 IU/L   ALT 15 0 - 32 IU/L  CBC with Differential/Platelet  Result Value Ref Range   WBC 6.2 3.4 - 10.8 x10E3/uL   RBC 3.86 3.77 - 5.28 x10E6/uL   Hemoglobin 11.7 11.1 - 15.9 g/dL   Hematocrit 35.5 34.0 - 46.6 %   MCV 92 79 - 97 fL   MCH 30.3 26.6 - 33.0 pg   MCHC 33.0 31.5 - 35.7 g/dL   RDW 13.1 11.7 - 15.4 %   Platelets 355 150 - 450 x10E3/uL   Neutrophils 51 Not Estab. %   Lymphs 38 Not Estab. %   Monocytes 8 Not Estab. %   Eos 2 Not Estab. %   Basos 1 Not Estab. %   Neutrophils Absolute 3.2 1.4 - 7.0 x10E3/uL   Lymphocytes Absolute 2.3 0.7 - 3.1 x10E3/uL   Monocytes Absolute 0.5 0.1 - 0.9 x10E3/uL   EOS (ABSOLUTE) 0.1 0.0 - 0.4 x10E3/uL   Basophils Absolute 0.1 0.0 - 0.2 x10E3/uL   Immature Granulocytes 0 Not Estab. %   Immature Grans (Abs) 0.0 0.0 - 0.1 x10E3/uL        Assessment & Plan:   Problem List Items Addressed This Visit       Respiratory   Asthma    Patient stated was diagnosis with Asthma since she was a child. Bilateral Left upper lung decreased breath sounds and wheezing auscultated. Place on a maintenance program of albuterol and  Symbicort inhaler      Relevant Medications   albuterol (VENTOLIN HFA)  108 (90 Base) MCG/ACT inhaler   budesonide-formoterol (SYMBICORT) 80-4.5 MCG/ACT inhaler   Other Visit Diagnoses     Primary hypertension    -  Primary   Relevant Orders   Microalbumin / creatinine urine ratio (Completed)   CMP14+EGFR (Completed)   Need for hepatitis C screening test       Relevant Orders   Hepatitis C antibody (Completed)   Vitamin D deficiency       Relevant Orders   VITAMIN D 25 Hydroxy (Vit-D Deficiency, Fractures) (Completed)   Screening for HIV (human immunodeficiency virus)       Relevant Orders   HIV Antibody (routine testing w rflx) (Completed)   Hypothyroidism, unspecified type       Relevant Orders   TSH + free T4 (Completed)   IFG (impaired fasting glucose)       Relevant Orders   Hemoglobin A1c (Completed)   Hyperlipidemia, unspecified hyperlipidemia type       Relevant Orders   Lipid panel (Completed)   CBC with Differential/Platelet (Completed)   Screening for colon cancer       Relevant Orders   Cologuard   Encounter for screening for malignant neoplasm of breast, unspecified screening modality       Relevant Orders   MM Digital Screening   Need for influenza vaccination       Relevant Orders   Flu Vaccine QUAD 6+ mos PF IM (Fluarix Quad PF) (Completed)       Meds ordered this encounter  Medications   albuterol (VENTOLIN HFA) 108 (90 Base) MCG/ACT inhaler    Sig: Inhale 2 puffs into the lungs every 6 (six) hours as needed for wheezing or shortness of breath.    Dispense:  8 g    Refill:  2   budesonide-formoterol (SYMBICORT) 80-4.5 MCG/ACT inhaler    Sig: Inhale 2 puffs into the lungs 2 (two) times daily.    Dispense:  1 each    Refill:  3    No follow-ups on file.  Cruzita Lederer Newman Nip, FNP

## 2022-02-19 NOTE — Patient Instructions (Addendum)
Follow up in 3 months for management of chronic conditions.  Follow up for Pap smear in 6 months

## 2022-02-20 DIAGNOSIS — J45909 Unspecified asthma, uncomplicated: Secondary | ICD-10-CM | POA: Insufficient documentation

## 2022-02-20 NOTE — Assessment & Plan Note (Addendum)
Patient stated was diagnosis with Asthma since she was a child. Bilateral Left upper lung decreased breath sounds and wheezing auscultated. Place on a maintenance program of albuterol and Symbicort inhaler

## 2022-02-21 LAB — CMP14+EGFR
ALT: 15 IU/L (ref 0–32)
AST: 19 IU/L (ref 0–40)
Albumin/Globulin Ratio: 1.2 (ref 1.2–2.2)
Albumin: 4.1 g/dL (ref 3.9–4.9)
Alkaline Phosphatase: 87 IU/L (ref 44–121)
BUN/Creatinine Ratio: 13 (ref 12–28)
BUN: 13 mg/dL (ref 8–27)
Bilirubin Total: 0.4 mg/dL (ref 0.0–1.2)
CO2: 22 mmol/L (ref 20–29)
Calcium: 10 mg/dL (ref 8.7–10.3)
Chloride: 102 mmol/L (ref 96–106)
Creatinine, Ser: 0.98 mg/dL (ref 0.57–1.00)
Globulin, Total: 3.3 g/dL (ref 1.5–4.5)
Glucose: 85 mg/dL (ref 70–99)
Potassium: 3.8 mmol/L (ref 3.5–5.2)
Sodium: 138 mmol/L (ref 134–144)
Total Protein: 7.4 g/dL (ref 6.0–8.5)
eGFR: 64 mL/min/{1.73_m2} (ref >59)

## 2022-02-21 LAB — CBC WITH DIFFERENTIAL/PLATELET
Basophils Absolute: 0.1 10*3/uL (ref 0.0–0.2)
Basos: 1 %
EOS (ABSOLUTE): 0.1 10*3/uL (ref 0.0–0.4)
Eos: 2 %
Hematocrit: 35.5 % (ref 34.0–46.6)
Hemoglobin: 11.7 g/dL (ref 11.1–15.9)
Immature Grans (Abs): 0 10*3/uL (ref 0.0–0.1)
Immature Granulocytes: 0 %
Lymphocytes Absolute: 2.3 10*3/uL (ref 0.7–3.1)
Lymphs: 38 %
MCH: 30.3 pg (ref 26.6–33.0)
MCHC: 33 g/dL (ref 31.5–35.7)
MCV: 92 fL (ref 79–97)
Monocytes Absolute: 0.5 10*3/uL (ref 0.1–0.9)
Monocytes: 8 %
Neutrophils Absolute: 3.2 10*3/uL (ref 1.4–7.0)
Neutrophils: 51 %
Platelets: 355 10*3/uL (ref 150–450)
RBC: 3.86 x10E6/uL (ref 3.77–5.28)
RDW: 13.1 % (ref 11.7–15.4)
WBC: 6.2 10*3/uL (ref 3.4–10.8)

## 2022-02-21 LAB — LIPID PANEL
Chol/HDL Ratio: 5.9 ratio — ABNORMAL HIGH (ref 0.0–4.4)
Cholesterol, Total: 220 mg/dL — ABNORMAL HIGH (ref 100–199)
HDL: 37 mg/dL — ABNORMAL LOW (ref >39)
LDL Chol Calc (NIH): 128 mg/dL — ABNORMAL HIGH (ref 0–99)
Triglycerides: 311 mg/dL — ABNORMAL HIGH (ref 0–149)
VLDL Cholesterol Cal: 55 mg/dL — ABNORMAL HIGH (ref 5–40)

## 2022-02-21 LAB — HEPATITIS C ANTIBODY: Hep C Virus Ab: NONREACTIVE

## 2022-02-21 LAB — TSH+FREE T4
Free T4: 0.85 ng/dL (ref 0.82–1.77)
TSH: 1.71 u[IU]/mL (ref 0.450–4.500)

## 2022-02-21 LAB — HEMOGLOBIN A1C
Est. average glucose Bld gHb Est-mCnc: 123 mg/dL
Hgb A1c MFr Bld: 5.9 % — ABNORMAL HIGH (ref 4.8–5.6)

## 2022-02-21 LAB — HIV ANTIBODY (ROUTINE TESTING W REFLEX): HIV Screen 4th Generation wRfx: NONREACTIVE

## 2022-02-21 LAB — VITAMIN D 25 HYDROXY (VIT D DEFICIENCY, FRACTURES): Vit D, 25-Hydroxy: 19.3 ng/mL — ABNORMAL LOW (ref 30.0–100.0)

## 2022-02-21 LAB — MICROALBUMIN / CREATININE URINE RATIO
Creatinine, Urine: 135.9 mg/dL
Microalb/Creat Ratio: 17 mg/g creat (ref 0–29)
Microalbumin, Urine: 23.7 ug/mL

## 2022-02-27 ENCOUNTER — Telehealth: Payer: Self-pay | Admitting: Family Medicine

## 2022-02-27 NOTE — Telephone Encounter (Signed)
Hendricks Limes sister called said patient needs some instructions on how to use the cologuard. Call back  # [phone] this morning.

## 2022-02-27 NOTE — Telephone Encounter (Signed)
LMTCB.

## 2022-02-28 ENCOUNTER — Ambulatory Visit (HOSPITAL_COMMUNITY)
Admission: RE | Admit: 2022-02-28 | Discharge: 2022-02-28 | Disposition: A | Discharge status: Home / Self Care | Payer: BLUE CROSS/BLUE SHIELD | Source: Ambulatory Visit | Attending: Family Medicine | Admitting: Family Medicine

## 2022-02-28 DIAGNOSIS — Z1231 Encounter for screening mammogram for malignant neoplasm of breast: Secondary | ICD-10-CM | POA: Insufficient documentation

## 2022-02-28 DIAGNOSIS — Z1239 Encounter for other screening for malignant neoplasm of breast: Secondary | ICD-10-CM

## 2022-03-02 NOTE — Telephone Encounter (Signed)
Spoke with patient's sister Marnette Burgess.

## 2022-03-03 NOTE — Progress Notes (Signed)
Please Inform patient, Hemoglobin A1c 5.9 which indicates pre diabetes and cholesterol levels are elevated. F/U on 05/21/22 for possible medication management. I encouraged to start lifestyle modifications follow diet low in saturated fat, reduce dietary salt intake maintain an exercise routine 3 to 5 days a week for a minimum 30 minutes a day. It is important to follow a DASH diet which includes vegetables,fruits,whole grains, fat free or low fat diary,fish,poultry,beans,nuts and seeds,vegetable oils.

## 2022-03-13 ENCOUNTER — Encounter: Payer: Self-pay | Admitting: Family Medicine

## 2022-03-14 ENCOUNTER — Other Ambulatory Visit: Payer: Self-pay | Admitting: Family Medicine

## 2022-03-14 DIAGNOSIS — Z1211 Encounter for screening for malignant neoplasm of colon: Secondary | ICD-10-CM

## 2022-04-30 ENCOUNTER — Telehealth: Payer: Self-pay | Admitting: Family Medicine

## 2022-04-30 ENCOUNTER — Other Ambulatory Visit: Payer: Self-pay | Admitting: Family Medicine

## 2022-04-30 DIAGNOSIS — Z1211 Encounter for screening for malignant neoplasm of colon: Secondary | ICD-10-CM

## 2022-04-30 NOTE — Telephone Encounter (Signed)
Referral entered for colonoscopy  

## 2022-04-30 NOTE — Telephone Encounter (Signed)
Patient called asked can she be referral to have her colonoscopy at [facility] instead of the colon guard.  Call back  # to patient [phone]

## 2022-05-01 ENCOUNTER — Encounter: Payer: Self-pay | Admitting: *Deleted

## 2022-05-21 ENCOUNTER — Ambulatory Visit: Payer: BLUE CROSS/BLUE SHIELD | Admitting: Family Medicine

## 2022-05-22 ENCOUNTER — Telehealth: Payer: Self-pay | Admitting: *Deleted

## 2022-05-22 NOTE — Telephone Encounter (Signed)
  Procedure: colonoscopy  Height: 5'2 Weight: 120lbs        Have you had a colonoscopy before?  no  Do you have family history of colon cancer?  no  Do you have a family history of polyps? no  Previous colonoscopy with polyps removed? no  Do you have a history colorectal cancer?   no  Are you diabetic?  no  Do you have a prosthetic or mechanical heart valve? no  Do you have a pacemaker/defibrillator?   no  Have you had endocarditis/atrial fibrillation?  no  Do you use supplemental oxygen/CPAP?  no  Have you had joint replacement within the last 12 months?  no  Do you tend to be constipated or have to use laxatives?  no   Do you have history of alcohol use? If yes, how much and how often.  Yes, everyday  Do you have history or are you using drugs? If yes, what do are you  using?  no  Have you ever had a stroke/heart attack?  no  Have you ever had a heart or other vascular stent placed,?no  Do you take weight loss medication? no  female patients,: have you had a hysterectomy? no                              are you post menopausal?  yes                              do you still have your menstrual cycle? no    Date of last menstrual period?   Do you take any blood-thinning medications such as: (Plavix, aspirin, Coumadin, Aggrenox, Brilinta, Xarelto, Eliquis, Pradaxa, Savaysa or Effient)? no  If yes we need the name, milligram, dosage and who is prescribing doctor:               Current Outpatient Medications  Medication Sig Dispense Refill   acetaminophen (TYLENOL) 500 MG tablet Take 500 mg by mouth every 6 (six) hours as needed.     albuterol (VENTOLIN HFA) 108 (90 Base) MCG/ACT inhaler Inhale 2 puffs into the lungs every 6 (six) hours as needed for wheezing or shortness of breath. 8 g 2   budesonide-formoterol (SYMBICORT) 80-4.5 MCG/ACT inhaler Inhale 2 puffs into the lungs 2 (two) times daily. 1 each 3   ibuprofen (ADVIL) 200 MG tablet Take 200 mg by mouth every 6  (six) hours as needed.     No current facility-administered medications for this visit.    Allergies  Allergen Reactions   Penicillins Anaphylaxis   Sulfa Antibiotics Itching

## 2022-06-01 ENCOUNTER — Encounter: Payer: Self-pay | Admitting: Family Medicine

## 2022-06-01 ENCOUNTER — Ambulatory Visit (INDEPENDENT_AMBULATORY_CARE_PROVIDER_SITE_OTHER): Payer: BLUE CROSS/BLUE SHIELD | Admitting: Family Medicine

## 2022-06-01 VITALS — BP 132/88 | HR 72 | Ht 62.0 in | Wt 123.1 lb

## 2022-06-01 DIAGNOSIS — J4520 Mild intermittent asthma, uncomplicated: Secondary | ICD-10-CM

## 2022-06-01 DIAGNOSIS — I10 Essential (primary) hypertension: Secondary | ICD-10-CM | POA: Diagnosis not present

## 2022-06-01 DIAGNOSIS — Z23 Encounter for immunization: Secondary | ICD-10-CM | POA: Diagnosis not present

## 2022-06-01 DIAGNOSIS — Z13220 Encounter for screening for lipoid disorders: Secondary | ICD-10-CM

## 2022-06-01 DIAGNOSIS — F5101 Primary insomnia: Secondary | ICD-10-CM | POA: Diagnosis not present

## 2022-06-01 DIAGNOSIS — Z1211 Encounter for screening for malignant neoplasm of colon: Secondary | ICD-10-CM

## 2022-06-01 DIAGNOSIS — E559 Vitamin D deficiency, unspecified: Secondary | ICD-10-CM | POA: Diagnosis not present

## 2022-06-01 DIAGNOSIS — Z131 Encounter for screening for diabetes mellitus: Secondary | ICD-10-CM

## 2022-06-01 DIAGNOSIS — G4700 Insomnia, unspecified: Secondary | ICD-10-CM | POA: Insufficient documentation

## 2022-06-01 NOTE — Assessment & Plan Note (Signed)
Patient reported not using inhaler for Asthma  Advise patient on medication compliance Use Symbicort inhaler twice daily

## 2022-06-01 NOTE — Patient Instructions (Addendum)
It was pleasure meeting with you today. Please take medications as prescribed. Follow up with your primary health provider if any health concerns arises.  

## 2022-06-01 NOTE — Progress Notes (Signed)
Patient Office Visit   Subjective   Patient ID: Kristine [name], female    DOB: 01/29/1956  Age: 67 y.o. MRN: [id number]  CC:  Chief Complaint  Patient presents with   Follow-up    3 month f/u. Pt reports difficulty sleeping through the night.    [name] 67 year old female presents to the clinic for insomnia and 3 month follow up. She  has a past medical history of Asthma.  Insomnia Primary symptoms: fragmented sleep, frequent awakening.  Patient reports frequent awakening occurs nightly and has been gradually worsening since onset. The symptoms are aggravated by family issues and anxiety. How many beverages per day that contain caffeine: 0 - 1.  Types of beverages you drink: coffee and soda. The symptoms are relieved by certain positions and having the TV on.Typical bedtime:  8-10 P.M.How long after going to bed to you fall asleep: 15-30 minutes. PMH includes: family stress or anxiety.         Outpatient Encounter Medications as of 06/01/2022  Medication Sig   acetaminophen (TYLENOL) 500 MG tablet Take 500 mg by mouth every 6 (six) hours as needed.   albuterol (VENTOLIN HFA) 108 (90 Base) MCG/ACT inhaler Inhale 2 puffs into the lungs every 6 (six) hours as needed for wheezing or shortness of breath.   budesonide-formoterol (SYMBICORT) 80-4.5 MCG/ACT inhaler Inhale 2 puffs into the lungs 2 (two) times daily.   ibuprofen (ADVIL) 200 MG tablet Take 200 mg by mouth every 6 (six) hours as needed.   No facility-administered encounter medications on file as of 06/01/2022.    Past Surgical History:  Procedure Laterality Date   tubilagation  1981    Review of Systems  Constitutional:  Negative for chills and fever.  HENT:  Negative for tinnitus.   Eyes:  Negative for blurred vision.  Respiratory:  Positive for shortness of breath. Negative for cough.   Cardiovascular:  Negative for palpitations and leg swelling.  Gastrointestinal:  Negative for abdominal pain and  vomiting.  Genitourinary:  Negative for dysuria and urgency.  Musculoskeletal:  Negative for myalgias.  Neurological:  Negative for dizziness.  Psychiatric/Behavioral:  The patient has insomnia.       Objective    BP 132/88 (BP Location: Left Arm)   Pulse 72   Ht  (1.575 m)   Wt 123 lb 1.9 oz (55.8 kg)   SpO2 96%   BMI 22.52 kg/m   Physical Exam Vitals reviewed.  Constitutional:      General: She is not in acute distress.    Appearance: Normal appearance. She is not ill-appearing, toxic-appearing or diaphoretic.  HENT:     Head: Normocephalic.  Eyes:     General:        Right eye: No discharge.        Left eye: No discharge.     Conjunctiva/sclera: Conjunctivae normal.  Cardiovascular:     Pulses: Normal pulses.     Heart sounds: Normal heart sounds.  Pulmonary:     Breath sounds: Wheezing present.  Abdominal:     General: Bowel sounds are normal.     Palpations: Abdomen is soft.     Tenderness: There is no abdominal tenderness. There is no right CVA tenderness, left CVA tenderness or guarding.  Musculoskeletal:        General: Normal range of motion.     Cervical back: Normal range of motion.  Skin:    General: Skin is warm and  dry.     Capillary Refill: Capillary refill takes less than 2 seconds.  Neurological:     General: No focal deficit present.     Mental Status: She is alert and oriented to person, place, and time.     Coordination: Coordination normal.     Gait: Gait normal.  Psychiatric:        Mood and Affect: Mood normal.        Behavior: Behavior normal.        Thought Content: Thought content normal.        Judgment: Judgment normal.       Assessment & Plan:  Screening for lipid disorders -     Lipid panel  Primary hypertension -     CMP14+EGFR -     CBC with Differential/Platelet -     Microalbumin / creatinine urine ratio  Screening for diabetes mellitus -     Hemoglobin A1c  Vitamin D deficiency -     VITAMIN D 25 Hydroxy  (Vit-D Deficiency, Fractures)  Encounter for screening colonoscopy -     Ambulatory referral to Gastroenterology  Primary insomnia Assessment & Plan: Trial on OTC melatonin. Explained to go to bed at the same time each night and get up at the same time each morning, including on the weekends. Make sure your bedroom is quiet, dark, relaxing, and at a comfortable temperature. Remove electronic devices, such as TVs, computers, and smart phones, from the bedroom.    Mild intermittent asthma without complication Assessment & Plan: Patient reported not using inhaler for Asthma  Advise patient on medication compliance Use Symbicort inhaler twice daily     Return in about 1 month (around 07/01/2022) for Pap smear, Insomnia.   Cruzita Lederer Newman Nip, FNP

## 2022-06-01 NOTE — Assessment & Plan Note (Signed)
Trial on OTC melatonin. Explained to go to bed at the same time each night and get up at the same time each morning, including on the weekends. Make sure your bedroom is quiet, dark, relaxing, and at a comfortable temperature. Remove electronic devices, such as TVs, computers, and smart phones, from the bedroom.

## 2022-06-02 LAB — MICROALBUMIN / CREATININE URINE RATIO

## 2022-06-03 LAB — CBC WITH DIFFERENTIAL/PLATELET
Basophils Absolute: 0 10*3/uL (ref 0.0–0.2)
Basos: 1 %
EOS (ABSOLUTE): 0.1 10*3/uL (ref 0.0–0.4)
Eos: 1 %
Hematocrit: 41.4 % (ref 34.0–46.6)
Hemoglobin: 14 g/dL (ref 11.1–15.9)
Immature Grans (Abs): 0 10*3/uL (ref 0.0–0.1)
Immature Granulocytes: 0 %
Lymphocytes Absolute: 2.4 10*3/uL (ref 0.7–3.1)
Lymphs: 39 %
MCH: 30.8 pg (ref 26.6–33.0)
MCHC: 33.8 g/dL (ref 31.5–35.7)
MCV: 91 fL (ref 79–97)
Monocytes Absolute: 0.5 10*3/uL (ref 0.1–0.9)
Monocytes: 8 %
Neutrophils Absolute: 3.2 10*3/uL (ref 1.4–7.0)
Neutrophils: 51 %
Platelets: 267 10*3/uL (ref 150–450)
RBC: 4.55 x10E6/uL (ref 3.77–5.28)
RDW: 12.8 % (ref 11.7–15.4)
WBC: 6.2 10*3/uL (ref 3.4–10.8)

## 2022-06-03 LAB — CMP14+EGFR
ALT: 20 IU/L (ref 0–32)
AST: 33 IU/L (ref 0–40)
Albumin/Globulin Ratio: 1.3 (ref 1.2–2.2)
Albumin: 4.7 g/dL (ref 3.9–4.9)
Alkaline Phosphatase: 97 IU/L (ref 44–121)
BUN/Creatinine Ratio: 18 (ref 12–28)
BUN: 16 mg/dL (ref 8–27)
Bilirubin Total: 1 mg/dL (ref 0.0–1.2)
CO2: 23 mmol/L (ref 20–29)
Calcium: 10.2 mg/dL (ref 8.7–10.3)
Chloride: 102 mmol/L (ref 96–106)
Creatinine, Ser: 0.91 mg/dL (ref 0.57–1.00)
Globulin, Total: 3.7 g/dL (ref 1.5–4.5)
Glucose: 105 mg/dL — ABNORMAL HIGH (ref 70–99)
Potassium: 4 mmol/L (ref 3.5–5.2)
Sodium: 140 mmol/L (ref 134–144)
Total Protein: 8.4 g/dL (ref 6.0–8.5)
eGFR: 70 mL/min/{1.73_m2} (ref >59)

## 2022-06-03 LAB — LIPID PANEL
Chol/HDL Ratio: 4.1 ratio (ref 0.0–4.4)
Cholesterol, Total: 234 mg/dL — ABNORMAL HIGH (ref 100–199)
HDL: 57 mg/dL (ref >39)
LDL Chol Calc (NIH): 158 mg/dL — ABNORMAL HIGH (ref 0–99)
Triglycerides: 108 mg/dL (ref 0–149)
VLDL Cholesterol Cal: 19 mg/dL (ref 5–40)

## 2022-06-03 LAB — HEMOGLOBIN A1C
Est. average glucose Bld gHb Est-mCnc: 117 mg/dL
Hgb A1c MFr Bld: 5.7 % — ABNORMAL HIGH (ref 4.8–5.6)

## 2022-06-03 LAB — VITAMIN D 25 HYDROXY (VIT D DEFICIENCY, FRACTURES): Vit D, 25-Hydroxy: 21.9 ng/mL — ABNORMAL LOW (ref 30.0–100.0)

## 2022-06-03 LAB — MICROALBUMIN / CREATININE URINE RATIO: Creatinine, Urine: 172 mg/dL

## 2022-06-04 ENCOUNTER — Other Ambulatory Visit: Payer: Self-pay | Admitting: Family Medicine

## 2022-06-04 MED ORDER — ROSUVASTATIN CALCIUM 10 MG PO TABS: 10.0000 mg | ORAL_TABLET | Freq: Every day | ORAL | 3 refills | Status: DC

## 2022-06-04 NOTE — Progress Notes (Signed)
Please inform patient Cholesterol levels elevated, Treatment plan for this will include Crestor 10 mg daily meds sent to pharmacy   Advise lifestyle modifications follow diet low in saturated fat, reduce dietary salt intake, avoid fatty foods, maintain an exercise routine 3 to 5 days a week for a minimum total of 150 minutes.   Vitamin D levels low, I advise to taking  over the counter supplements of vitamin D 1000 IU/day to prevent low vitamin D levels. Consuming Vitamin D rich food sources include fish, salmon, sardines, egg yolks, red meat, liver, oranges, soy milk.    Hemoglobin A1c 5.7 indicates pre-diabetes No medication just lifestyle changes such as Following a DASH diet which includes vegetables,fruits,whole grains, fat free or low fat diary,fish,poultry,beans,nuts and seeds,vegetable oils. Find an activity that you will enjoyandstart to be active at least 5 days a week for 30 minutes each day.

## 2022-06-14 NOTE — Telephone Encounter (Signed)
Ok to schedule. ASA 3 due to daily etoh.

## 2022-06-15 NOTE — Telephone Encounter (Signed)
LMOVM to call back to schedule 

## 2022-06-18 NOTE — Telephone Encounter (Signed)
Called pt, no answer and VM full. Mychart message sent

## 2022-06-19 ENCOUNTER — Telehealth: Payer: Self-pay | Admitting: Family Medicine

## 2022-06-19 ENCOUNTER — Encounter: Payer: Self-pay | Admitting: *Deleted

## 2022-06-19 MED ORDER — PEG 3350-KCL-NA BICARB-NACL 420 G PO SOLR: 4000.0000 mL | Freq: Once | ORAL | 0 refills | Status: AC

## 2022-06-19 NOTE — Telephone Encounter (Signed)
Pt called in question about Colonoscopy. She has an upcoming appt, does she need to just wait until she comes in June?

## 2022-06-19 NOTE — Telephone Encounter (Signed)
Patient has referral for colonoscopy, GI office has been trying to reach her. Number given to patient to schedule colonoscopy. She has an appt with Iliana on 07/20/22.

## 2022-06-19 NOTE — Telephone Encounter (Signed)
Spoke with pt and grand daughter. Scheduled for 6/14 with Dr. Marletta Lor. Aware will send instructions/pre-op to mychart. Rx sent to pharmacy

## 2022-06-19 NOTE — Addendum Note (Signed)
Addended by: Armstead Peaks on: 06/19/2022 10:01 AM   Modules accepted: Orders

## 2022-06-20 NOTE — Telephone Encounter (Signed)
Referral completed

## 2022-06-29 ENCOUNTER — Other Ambulatory Visit: Payer: Self-pay

## 2022-06-29 ENCOUNTER — Encounter: Payer: Self-pay | Admitting: Family Medicine

## 2022-06-29 DIAGNOSIS — E785 Hyperlipidemia, unspecified: Secondary | ICD-10-CM

## 2022-06-29 MED ORDER — ROSUVASTATIN CALCIUM 10 MG PO TABS
10.0000 mg | ORAL_TABLET | Freq: Every day | ORAL | 3 refills | Status: DC
Start: 2022-06-29 — End: 2022-07-29

## 2022-07-17 ENCOUNTER — Other Ambulatory Visit: Payer: Self-pay

## 2022-07-17 ENCOUNTER — Telehealth: Payer: Self-pay | Admitting: Family Medicine

## 2022-07-17 DIAGNOSIS — J45909 Unspecified asthma, uncomplicated: Secondary | ICD-10-CM

## 2022-07-17 MED ORDER — BUDESONIDE-FORMOTEROL FUMARATE 80-4.5 MCG/ACT IN AERO
2.0000 | INHALATION_SPRAY | Freq: Two times a day (BID) | RESPIRATORY_TRACT | 3 refills | Status: DC
Start: 2022-07-17 — End: 2022-07-29

## 2022-07-17 MED ORDER — ALBUTEROL SULFATE HFA 108 (90 BASE) MCG/ACT IN AERS
2.0000 | INHALATION_SPRAY | Freq: Four times a day (QID) | RESPIRATORY_TRACT | 2 refills | Status: DC | PRN
Start: 2022-07-17 — End: 2022-07-29

## 2022-07-17 NOTE — Telephone Encounter (Signed)
Prescription Request  07/17/2022  LOV: 06/01/2022  What is the name of the medication or equipment? albuterol (VENTOLIN HFA) 108 (90 Base) MCG/ACT inhaler   budesonide-formoterol (SYMBICORT) 80-4.5 MCG/ACT inhaler ( pt thinks that insurance is not covering med fully wants a call back in regard)      Have you contacted your pharmacy to request a refill? Yes   Which pharmacy would you like this sent to?  [facility] [address], [place] - [address] [place] [address] Phone: [phone] Fax: [phone]    Patient notified that their request is being sent to the clinical staff for review and that they should receive a response within 2 business days.   Please advise at [facility] [phone]

## 2022-07-17 NOTE — Telephone Encounter (Signed)
Tried calling patient. No answer, VM full. Meds refilled. We are not able to see what insurance will cover, the pharmacy can.

## 2022-07-20 ENCOUNTER — Ambulatory Visit (INDEPENDENT_AMBULATORY_CARE_PROVIDER_SITE_OTHER): Payer: Medicare Other | Admitting: Family Medicine

## 2022-07-20 ENCOUNTER — Encounter: Payer: Self-pay | Admitting: Family Medicine

## 2022-07-20 ENCOUNTER — Other Ambulatory Visit (HOSPITAL_COMMUNITY)
Admission: RE | Admit: 2022-07-20 | Discharge: 2022-07-20 | Disposition: A | Discharge status: Home / Self Care | Payer: Medicare Other | Source: Ambulatory Visit | Attending: Family Medicine | Admitting: Family Medicine

## 2022-07-20 VITALS — BP 154/100 | HR 80 | Ht 62.0 in | Wt 118.0 lb

## 2022-07-20 DIAGNOSIS — Z124 Encounter for screening for malignant neoplasm of cervix: Secondary | ICD-10-CM

## 2022-07-20 DIAGNOSIS — Z1151 Encounter for screening for human papillomavirus (HPV): Secondary | ICD-10-CM | POA: Diagnosis not present

## 2022-07-20 DIAGNOSIS — F5102 Adjustment insomnia: Secondary | ICD-10-CM

## 2022-07-20 DIAGNOSIS — Z01419 Encounter for gynecological examination (general) (routine) without abnormal findings: Secondary | ICD-10-CM | POA: Insufficient documentation

## 2022-07-20 MED ORDER — TRAZODONE HCL 50 MG PO TABS
25.0000 mg | ORAL_TABLET | Freq: Every evening | ORAL | 3 refills | Status: DC | PRN
Start: 2022-07-20 — End: 2022-07-29

## 2022-07-20 NOTE — Assessment & Plan Note (Signed)
Trial on Trazodone 25 mg PRN Continued discussion on being consistent with same sleep schedule, Avoid large meals, caffeine, and alcohol before bedtime.

## 2022-07-20 NOTE — Assessment & Plan Note (Signed)
Pap smear done, Patient tolerated procedure well. Informed patient I will keep them updated on results. Right breast normal without mass, skin or nipple changes or axillary nodes, left breast normal without mass, skin or nipple changes or axillary nodes. 

## 2022-07-20 NOTE — Patient Instructions (Signed)
        Great to see you today.   - Please take medications as prescribed. - Follow up with your primary health provider if any health concerns arises. - If symptoms worsen please contact your primary care provider and/or visit the emergency department.  

## 2022-07-20 NOTE — Progress Notes (Signed)
Patient Office Visit   Subjective   Patient ID: IDALENE ZIMA, female    DOB: 1955/10/06  Age: 67 y.o. MRN: [id number]  CC:  Chief Complaint  Patient presents with   Annual Exam    [name] 67 year old female, presents to the clinic for pap smear, She  has a past medical history of Asthma.For the details of today's visit, please refer to assessment and plan.   HPI    Outpatient Encounter Medications as of 07/20/2022  Medication Sig   albuterol (VENTOLIN HFA) 108 (90 Base) MCG/ACT inhaler Inhale 2 puffs into the lungs every 6 (six) hours as needed for wheezing or shortness of breath.   budesonide-formoterol (SYMBICORT) 80-4.5 MCG/ACT inhaler Inhale 2 puffs into the lungs 2 (two) times daily.   rosuvastatin (CRESTOR) 10 MG tablet Take 1 tablet (10 mg total) by mouth daily.   traZODone (DESYREL) 50 MG tablet Take 0.5-1 tablets (25-50 mg total) by mouth at bedtime as needed for sleep.   [DISCONTINUED] acetaminophen (TYLENOL) 500 MG tablet Take 500 mg by mouth every 6 (six) hours as needed. (Patient not taking: Reported on 07/20/2022)   [DISCONTINUED] ibuprofen (ADVIL) 200 MG tablet Take 200 mg by mouth every 6 (six) hours as needed. (Patient not taking: Reported on 07/20/2022)   No facility-administered encounter medications on file as of 07/20/2022.    Past Surgical History:  Procedure Laterality Date   tubilagation  1981    Review of Systems  Constitutional:  Negative for chills and fever.  Eyes:  Negative for blurred vision.  Respiratory:  Negative for shortness of breath.   Cardiovascular:  Negative for chest pain.  Gastrointestinal:  Negative for heartburn.  Genitourinary:  Negative for dysuria, flank pain, frequency, hematuria and urgency.  Skin:  Negative for rash.  Neurological:  Negative for dizziness and headaches.  Psychiatric/Behavioral:  The patient has insomnia.       Objective    BP (!) 154/100   Pulse 80   Ht 5\' 2"  (1.575 m)   Wt 118 lb (53.5 kg)    SpO2 92%   BMI 21.58 kg/m   Physical Exam Vitals reviewed.  Constitutional:      General: She is not in acute distress.    Appearance: Normal appearance. She is not ill-appearing, toxic-appearing or diaphoretic.  HENT:     Head: Normocephalic.  Eyes:     General:        Right eye: No discharge.        Left eye: No discharge.     Conjunctiva/sclera: Conjunctivae normal.  Cardiovascular:     Rate and Rhythm: Normal rate.     Pulses: Normal pulses.     Heart sounds: Normal heart sounds.  Pulmonary:     Effort: Pulmonary effort is normal. No respiratory distress.     Breath sounds: Normal breath sounds.  Genitourinary:    General: Normal vulva.     Exam position: Lithotomy position.     Pubic Area: No rash or pubic lice.      Labia:        Right: No rash or tenderness.        Left: No rash or tenderness.      Vagina: Normal.     Cervix: Normal.  Musculoskeletal:        General: Normal range of motion.     Cervical back: Normal range of motion.  Skin:    General: Skin is warm and dry.  Capillary Refill: Capillary refill takes less than 2 seconds.  Neurological:     General: No focal deficit present.     Mental Status: She is alert and oriented to person, place, and time.     Coordination: Coordination normal.     Gait: Gait normal.  Psychiatric:        Mood and Affect: Mood normal.        Behavior: Behavior normal.       Assessment & Plan:  Encounter for Papanicolaou smear of cervix Assessment & Plan: Pap smear done, Patient tolerated procedure well. Informed patient I will keep them updated on results. Right breast normal without mass, skin or nipple changes or axillary nodes, left breast normal without mass, skin or nipple changes or axillary nodes.  Orders: -     Cytology - PAP  Adjustment insomnia Assessment & Plan: Trial on Trazodone 25 mg PRN Continued discussion on being consistent with same sleep schedule, Avoid large meals, caffeine, and alcohol  before bedtime.   Orders: -     traZODone HCl; Take 0.5-1 tablets (25-50 mg total) by mouth at bedtime as needed for sleep.  Dispense: 30 tablet; Refill: 3    Return in about 2 weeks (around 08/03/2022) for re-check blood pressure, hypertension.   Cruzita Lederer Newman Nip, FNP

## 2022-07-24 ENCOUNTER — Encounter (HOSPITAL_COMMUNITY)
Admission: RE | Admit: 2022-07-24 | Discharge: 2022-07-24 | Disposition: A | Discharge status: Home / Self Care | Payer: Medicare Other | Source: Ambulatory Visit | Attending: Internal Medicine | Admitting: Internal Medicine

## 2022-07-24 ENCOUNTER — Encounter (HOSPITAL_COMMUNITY): Payer: Self-pay

## 2022-07-24 ENCOUNTER — Other Ambulatory Visit: Payer: Self-pay

## 2022-07-24 HISTORY — DX: Prediabetes: R73.03

## 2022-07-25 ENCOUNTER — Other Ambulatory Visit (HOSPITAL_COMMUNITY): Payer: BLUE CROSS/BLUE SHIELD

## 2022-07-27 ENCOUNTER — Observation Stay (HOSPITAL_COMMUNITY)
Admission: RE | Admit: 2022-07-27 | Discharge: 2022-07-29 | Disposition: A | Discharge status: Home / Self Care | Payer: Medicare Other | Attending: Family Medicine | Admitting: Family Medicine

## 2022-07-27 ENCOUNTER — Ambulatory Visit (HOSPITAL_COMMUNITY): Payer: Medicare Other | Admitting: Anesthesiology

## 2022-07-27 ENCOUNTER — Encounter (HOSPITAL_COMMUNITY)
Admission: RE | Disposition: A | Discharge status: Home / Self Care | Payer: Self-pay | Source: Home / Self Care | Attending: Family Medicine

## 2022-07-27 ENCOUNTER — Ambulatory Visit (HOSPITAL_BASED_OUTPATIENT_CLINIC_OR_DEPARTMENT_OTHER): Payer: Medicare Other | Admitting: Anesthesiology

## 2022-07-27 DIAGNOSIS — Z720 Tobacco use: Secondary | ICD-10-CM | POA: Diagnosis present

## 2022-07-27 DIAGNOSIS — D122 Benign neoplasm of ascending colon: Secondary | ICD-10-CM | POA: Diagnosis not present

## 2022-07-27 DIAGNOSIS — I10 Essential (primary) hypertension: Secondary | ICD-10-CM | POA: Diagnosis not present

## 2022-07-27 DIAGNOSIS — K91840 Postprocedural hemorrhage and hematoma of a digestive system organ or structure following a digestive system procedure: Secondary | ICD-10-CM | POA: Insufficient documentation

## 2022-07-27 DIAGNOSIS — K922 Gastrointestinal hemorrhage, unspecified: Secondary | ICD-10-CM | POA: Diagnosis present

## 2022-07-27 DIAGNOSIS — K635 Polyp of colon: Secondary | ICD-10-CM | POA: Diagnosis present

## 2022-07-27 DIAGNOSIS — Z1211 Encounter for screening for malignant neoplasm of colon: Secondary | ICD-10-CM | POA: Diagnosis not present

## 2022-07-27 DIAGNOSIS — D120 Benign neoplasm of cecum: Secondary | ICD-10-CM | POA: Insufficient documentation

## 2022-07-27 DIAGNOSIS — F5102 Adjustment insomnia: Secondary | ICD-10-CM

## 2022-07-27 DIAGNOSIS — J45909 Unspecified asthma, uncomplicated: Secondary | ICD-10-CM | POA: Diagnosis not present

## 2022-07-27 DIAGNOSIS — D62 Acute posthemorrhagic anemia: Secondary | ICD-10-CM

## 2022-07-27 DIAGNOSIS — Z79899 Other long term (current) drug therapy: Secondary | ICD-10-CM | POA: Insufficient documentation

## 2022-07-27 DIAGNOSIS — F17210 Nicotine dependence, cigarettes, uncomplicated: Secondary | ICD-10-CM | POA: Diagnosis not present

## 2022-07-27 DIAGNOSIS — F1010 Alcohol abuse, uncomplicated: Secondary | ICD-10-CM | POA: Diagnosis present

## 2022-07-27 DIAGNOSIS — E876 Hypokalemia: Secondary | ICD-10-CM | POA: Insufficient documentation

## 2022-07-27 DIAGNOSIS — D126 Benign neoplasm of colon, unspecified: Secondary | ICD-10-CM | POA: Diagnosis not present

## 2022-07-27 DIAGNOSIS — E785 Hyperlipidemia, unspecified: Secondary | ICD-10-CM

## 2022-07-27 HISTORY — PX: POLYPECTOMY: SHX149

## 2022-07-27 HISTORY — PX: HEMOSTASIS CLIP PLACEMENT: SHX6857

## 2022-07-27 HISTORY — PX: COLONOSCOPY WITH PROPOFOL: SHX5780

## 2022-07-27 LAB — HEMOGLOBIN AND HEMATOCRIT, BLOOD
HCT: 31.4 % — ABNORMAL LOW (ref 36.0–46.0)
HCT: 28.5 % — ABNORMAL LOW (ref 36.0–46.0)
Hemoglobin: 10.3 g/dL — ABNORMAL LOW (ref 12.0–15.0)
Hemoglobin: 9.3 g/dL — ABNORMAL LOW (ref 12.0–15.0)

## 2022-07-27 LAB — BASIC METABOLIC PANEL
Anion gap: 10 (ref 5–15)
BUN: 10 mg/dL (ref 8–23)
CO2: 26 mmol/L (ref 22–32)
Calcium: 9.4 mg/dL (ref 8.9–10.3)
Chloride: 101 mmol/L (ref 98–111)
Creatinine, Ser: 0.95 mg/dL (ref 0.44–1.00)
GFR, Estimated: >60 mL/min (ref >60)
Glucose, Bld: 133 mg/dL — ABNORMAL HIGH (ref 70–99)
Potassium: 2.9 mmol/L — ABNORMAL LOW (ref 3.5–5.1)
Sodium: 137 mmol/L (ref 135–145)

## 2022-07-27 LAB — TYPE AND SCREEN
ABO/RH(D): A POS
Antibody Screen: NEGATIVE

## 2022-07-27 LAB — ABO/RH: ABO/RH(D): A POS

## 2022-07-27 LAB — MAGNESIUM: Magnesium: 1.4 mg/dL — ABNORMAL LOW (ref 1.7–2.4)

## 2022-07-27 SURGERY — COLONOSCOPY WITH PROPOFOL
Anesthesia: General

## 2022-07-27 MED ORDER — HYDRALAZINE HCL 20 MG/ML IJ SOLN: 10.0000 mg | Freq: Four times a day (QID) | INTRAMUSCULAR | Status: DC | PRN

## 2022-07-27 MED ORDER — SODIUM CHLORIDE 0.9% FLUSH
3.0000 mL | Freq: Two times a day (BID) | INTRAVENOUS | Status: DC
  Administered 2022-07-29: 3 mL via INTRAVENOUS

## 2022-07-27 MED ORDER — THIAMINE HCL 100 MG/ML IJ SOLN
100.0000 mg | Freq: Every day | INTRAMUSCULAR | Status: DC
  Filled 2022-07-27: qty 2

## 2022-07-27 MED ORDER — MOMETASONE FURO-FORMOTEROL FUM 100-5 MCG/ACT IN AERO
2.0000 | INHALATION_SPRAY | Freq: Two times a day (BID) | RESPIRATORY_TRACT | Status: DC
  Administered 2022-07-27 – 2022-07-29 (×4): 2 via RESPIRATORY_TRACT
  Filled 2022-07-27 (×2): qty 8.8

## 2022-07-27 MED ORDER — LACTATED RINGERS IV SOLN: INTRAVENOUS | Status: DC

## 2022-07-27 MED ORDER — PROPOFOL 500 MG/50ML IV EMUL
INTRAVENOUS | Status: DC | PRN
  Administered 2022-07-27 (×2): 115 ug/kg/min via INTRAVENOUS
  Administered 2022-07-27: 100 ug/kg/min via INTRAVENOUS
  Administered 2022-07-27: 115 ug/kg/min via INTRAVENOUS

## 2022-07-27 MED ORDER — ACETAMINOPHEN 650 MG RE SUPP: 650.0000 mg | Freq: Four times a day (QID) | RECTAL | Status: DC | PRN

## 2022-07-27 MED ORDER — ROSUVASTATIN CALCIUM 20 MG PO TABS
10.0000 mg | ORAL_TABLET | Freq: Every day | ORAL | Status: DC
  Administered 2022-07-27 – 2022-07-29 (×3): 10 mg via ORAL
  Filled 2022-07-27 (×4): qty 1

## 2022-07-27 MED ORDER — SODIUM CHLORIDE 0.9% FLUSH
3.0000 mL | Freq: Two times a day (BID) | INTRAVENOUS | Status: DC
  Administered 2022-07-28 – 2022-07-29 (×2): 3 mL via INTRAVENOUS

## 2022-07-27 MED ORDER — CHLORDIAZEPOXIDE HCL 5 MG PO CAPS
10.0000 mg | ORAL_CAPSULE | Freq: Three times a day (TID) | ORAL | Status: DC
  Administered 2022-07-27 – 2022-07-29 (×6): 10 mg via ORAL
  Filled 2022-07-27 (×6): qty 2

## 2022-07-27 MED ORDER — ACETAMINOPHEN 325 MG PO TABS
650.0000 mg | ORAL_TABLET | Freq: Four times a day (QID) | ORAL | Status: DC | PRN
  Administered 2022-07-28: 650 mg via ORAL
  Filled 2022-07-27: qty 2

## 2022-07-27 MED ORDER — LORAZEPAM 1 MG PO TABS
1.0000 mg | ORAL_TABLET | ORAL | Status: DC | PRN
  Administered 2022-07-27: 1 mg via ORAL
  Filled 2022-07-27: qty 2

## 2022-07-27 MED ORDER — BISACODYL 10 MG RE SUPP: 10.0000 mg | Freq: Every day | RECTAL | Status: DC | PRN

## 2022-07-27 MED ORDER — FOLIC ACID 1 MG PO TABS
1.0000 mg | ORAL_TABLET | Freq: Every day | ORAL | Status: DC
  Administered 2022-07-27 – 2022-07-29 (×3): 1 mg via ORAL
  Filled 2022-07-27 (×3): qty 1

## 2022-07-27 MED ORDER — ALBUTEROL SULFATE (2.5 MG/3ML) 0.083% IN NEBU: 2.5000 mg | INHALATION_SOLUTION | RESPIRATORY_TRACT | Status: DC | PRN

## 2022-07-27 MED ORDER — POLYETHYLENE GLYCOL 3350 17 G PO PACK: 17.0000 g | PACK | Freq: Every day | ORAL | Status: DC | PRN

## 2022-07-27 MED ORDER — LORAZEPAM 2 MG/ML IJ SOLN: 1.0000 mg | INTRAMUSCULAR | Status: DC | PRN

## 2022-07-27 MED ORDER — PANTOPRAZOLE SODIUM 40 MG PO TBEC
40.0000 mg | DELAYED_RELEASE_TABLET | Freq: Every day | ORAL | Status: DC
  Administered 2022-07-27 – 2022-07-29 (×3): 40 mg via ORAL
  Filled 2022-07-27 (×4): qty 1

## 2022-07-27 MED ORDER — ONDANSETRON HCL 4 MG/2ML IJ SOLN: 4.0000 mg | Freq: Four times a day (QID) | INTRAMUSCULAR | Status: DC | PRN

## 2022-07-27 MED ORDER — LABETALOL HCL 5 MG/ML IV SOLN
INTRAVENOUS | Status: DC | PRN
  Administered 2022-07-27 (×4): 5 mg via INTRAVENOUS

## 2022-07-27 MED ORDER — TRAZODONE HCL 50 MG PO TABS: 50.0000 mg | ORAL_TABLET | Freq: Every evening | ORAL | Status: DC | PRN

## 2022-07-27 MED ORDER — PROPOFOL 10 MG/ML IV BOLUS
INTRAVENOUS | Status: DC | PRN
  Administered 2022-07-27: 120 mg via INTRAVENOUS
  Administered 2022-07-27: 20 mg via INTRAVENOUS

## 2022-07-27 MED ORDER — SODIUM CHLORIDE 0.9 % IV SOLN: INTRAVENOUS | Status: DC | PRN

## 2022-07-27 MED ORDER — AMLODIPINE BESYLATE 5 MG PO TABS
5.0000 mg | ORAL_TABLET | Freq: Every day | ORAL | Status: DC
  Filled 2022-07-27: qty 1

## 2022-07-27 MED ORDER — NICOTINE 21 MG/24HR TD PT24
21.0000 mg | MEDICATED_PATCH | Freq: Every day | TRANSDERMAL | Status: DC
  Administered 2022-07-27 – 2022-07-29 (×3): 21 mg via TRANSDERMAL
  Filled 2022-07-27 (×3): qty 1

## 2022-07-27 MED ORDER — POTASSIUM CHLORIDE CRYS ER 20 MEQ PO TBCR
40.0000 meq | EXTENDED_RELEASE_TABLET | ORAL | Status: AC
  Administered 2022-07-27 (×2): 40 meq via ORAL
  Filled 2022-07-27 (×2): qty 2

## 2022-07-27 MED ORDER — ONDANSETRON HCL 4 MG PO TABS: 4.0000 mg | ORAL_TABLET | Freq: Four times a day (QID) | ORAL | Status: DC | PRN

## 2022-07-27 MED ORDER — SODIUM CHLORIDE 0.9% FLUSH: 3.0000 mL | INTRAVENOUS | Status: DC | PRN

## 2022-07-27 MED ORDER — ESMOLOL HCL 100 MG/10ML IV SOLN
INTRAVENOUS | Status: DC | PRN
  Administered 2022-07-27: 30 ug via INTRAVENOUS
  Administered 2022-07-27: 20 ug via INTRAVENOUS

## 2022-07-27 MED ORDER — TRAZODONE HCL 50 MG PO TABS
100.0000 mg | ORAL_TABLET | Freq: Every day | ORAL | Status: DC
  Administered 2022-07-27 – 2022-07-28 (×2): 100 mg via ORAL
  Filled 2022-07-27 (×2): qty 2

## 2022-07-27 MED ORDER — ADULT MULTIVITAMIN W/MINERALS CH
1.0000 | ORAL_TABLET | Freq: Every day | ORAL | Status: DC
  Administered 2022-07-27 – 2022-07-29 (×3): 1 via ORAL
  Filled 2022-07-27 (×3): qty 1

## 2022-07-27 MED ORDER — THIAMINE MONONITRATE 100 MG PO TABS
100.0000 mg | ORAL_TABLET | Freq: Every day | ORAL | Status: DC
  Administered 2022-07-27 – 2022-07-29 (×3): 100 mg via ORAL
  Filled 2022-07-27 (×3): qty 1

## 2022-07-27 NOTE — Anesthesia Preprocedure Evaluation (Signed)
Anesthesia Evaluation  Patient identified by MRN, date of birth, ID band Patient awake    Reviewed: Allergy & Precautions, H&P , NPO status , Patient's Chart, lab work & pertinent test results  Airway Mallampati: III  TM Distance: >3 FB Neck ROM: Full  Mouth opening: Limited Mouth Opening  Dental  (+) Missing, Chipped, Dental Advisory Given, Poor Dentition   Pulmonary asthma , Current SmokerPatient did not abstain from smoking.   Pulmonary exam normal breath sounds clear to auscultation       Cardiovascular negative cardio ROS Normal cardiovascular exam Rhythm:Regular Rate:Normal     Neuro/Psych negative neurological ROS  negative psych ROS   GI/Hepatic negative GI ROS,,,(+)     substance abuse  alcohol use  Endo/Other  negative endocrine ROS    Renal/GU negative Renal ROS  negative genitourinary   Musculoskeletal negative musculoskeletal ROS (+)    Abdominal   Peds negative pediatric ROS (+)  Hematology negative hematology ROS (+)   Anesthesia Other Findings   Reproductive/Obstetrics negative OB ROS                             Anesthesia Physical Anesthesia Plan  ASA: 2  Anesthesia Plan: General   Post-op Pain Management: Minimal or no pain anticipated   Induction: Intravenous  PONV Risk Score and Plan: 1 and Propofol infusion  Airway Management Planned: Nasal Cannula and Natural Airway  Additional Equipment:   Intra-op Plan:   Post-operative Plan:   Informed Consent: I have reviewed the patients History and Physical, chart, labs and discussed the procedure including the risks, benefits and alternatives for the proposed anesthesia with the patient or authorized representative who has indicated his/her understanding and acceptance.     Dental advisory given  Plan Discussed with: CRNA and Surgeon  Anesthesia Plan Comments:        Anesthesia Quick Evaluation

## 2022-07-27 NOTE — Progress Notes (Signed)
Now H&H drawn right [facility]

## 2022-07-27 NOTE — H&P (Signed)
Primary Care Physician:  Rica Records, FNP Primary Gastroenterologist:  Dr. Marletta Lor  Pre-Procedure History & Physical: HPI:  Kristine [name] is a 67 y.o. female is here for first ever colonoscopy for colon cancer screening purposes.  Patient denies any family history of colorectal cancer.  [name] melena or hematochezia.  [name] abdominal pain or unintentional weight loss.  [name] change in bowel habits.  Overall feels well from a GI standpoint.  Past Medical History:  Diagnosis Date   Asthma    Pre-diabetes     Past Surgical History:  Procedure Laterality Date   tubilagation  1981    Prior to Admission medications   Medication Sig Start Date End Date Taking? Authorizing Provider  albuterol (VENTOLIN HFA) 108 (90 Base) MCG/ACT inhaler Inhale 2 puffs into the lungs every 6 (six) hours as needed for wheezing or shortness of breath. 07/17/22  Yes [name], Tenna Child, FNP  budesonide-formoterol (SYMBICORT) 80-4.5 MCG/ACT inhaler Inhale 2 puffs into the lungs 2 (two) times daily. 07/17/22  Yes [name], Tenna Child, FNP  cholecalciferol (VITAMIN D3) 25 MCG (1000 UNIT) tablet Take 1,000 Units by mouth daily.   Yes [provider]  Omega-3 Fatty Acids (OMEGA 3 PO) Take 1 capsule by mouth daily.   Yes [provider]  rosuvastatin (CRESTOR) 10 MG tablet Take 1 tablet (10 mg total) by mouth daily. 06/29/22  Yes [name], Tenna Child, FNP  traZODone (DESYREL) 50 MG tablet Take 0.5-1 tablets (25-50 mg total) by mouth at bedtime as needed for sleep. 07/20/22   Del Nigel Berthold, FNP    Allergies as of 06/19/2022 - Review Complete 06/01/2022  Allergen Reaction Noted   Penicillins Anaphylaxis 02/23/2019   Sulfa antibiotics Itching 02/23/2019    Family History  Problem Relation Age of Onset   Diabetes type II Mother    Hypertension Mother    Cancer Sister    Hypertension Brother     Social History   Socioeconomic History   Marital status: Widowed    Spouse  name: Not on file   Number of children: 3   Years of education: Not on file   Highest education level: Not on file  Occupational History   Not on file  Tobacco Use   Smoking status: Every Day    Packs/day: 1.00    Years: 45.00    Additional pack years: 0.00    Total pack years: 45.00    Types: Cigarettes   Smokeless tobacco: Never  Vaping Use   Vaping Use: Never used  Substance and Sexual Activity   Alcohol use: Yes    Alcohol/week: 14.0 standard drinks of alcohol    Types: 14 Cans of beer per week    Comment: daily   Drug use: Never   Sexual activity: Not Currently    Birth control/protection: Post-menopausal  Other Topics Concern   Not on file  Social History Narrative   Not on file   Social Determinants of Health   Financial Resource Strain: Not on file  Food Insecurity: Not on file  Transportation Needs: Not on file  Physical Activity: Not on file  Stress: Not on file  Social Connections: Not on file  Intimate Partner Violence: Not on file    Review of Systems: See HPI, otherwise negative ROS  Physical Exam: Vital signs in last 24 hours: Temp:  [98.6 F (37 C)] 98.6 F (37 C) (06/14 0748) Pulse Rate:  [60] 60 (06/14 0748) Resp:  [12] 12 (  06/14 0748) BP: (172)/(89) 172/89 (06/14 0748) SpO2:  [100 %] 100 % (06/14 0748) Weight:  [53.5 kg] 53.5 kg (06/14 0748)   General:   Alert,  Well-developed, well-nourished, pleasant and cooperative in NAD Head:  Normocephalic and atraumatic. Eyes:  Sclera clear, [name] icterus.   Conjunctiva pink. Ears:  Normal auditory acuity. Nose:  [name] deformity, discharge,  or lesions. Msk:  Symmetrical without gross deformities. Normal posture. Extremities:  Without clubbing or edema. Neurologic:  Alert and  oriented x4;  grossly normal neurologically. Skin:  Intact without significant lesions or rashes. Psych:  Alert and cooperative. Normal mood and affect.  Impression/Plan: Kristine [name] is here for a colonoscopy to be  performed for colon cancer screening purposes.  The risks of the procedure including infection, bleed, or perforation as well as benefits, limitations, alternatives and imponderables have been reviewed with the patient. Questions have been answered. All parties agreeable.

## 2022-07-27 NOTE — Anesthesia Procedure Notes (Signed)
Date/Time: 07/27/2022 8:27 AM  Performed by: Franco Nones, CRNAPre-anesthesia Checklist: Patient identified, Emergency Drugs available, Suction available, Timeout performed and Patient being monitored Patient Re-evaluated:Patient Re-evaluated prior to induction Oxygen Delivery Method: Nasal Cannula

## 2022-07-27 NOTE — H&P (Signed)
Patient Demographics:    Kristine [name], is a 67 y.o. female  MRN: [id number]   DOB - 10/06/1955  Admit Date - 07/27/2022  Outpatient Primary MD for the patient is [name], Tenna Child, FNP   Assessment & Plan:   Assessment and Plan: 1) acute GI bleed post polypectomy/ABLA---  Large postpolypectomy bleed then ensued-after removal of large polyp greater than 5 cm- -hemostasis achieved after clip was placed -Hgb 14.0 >> 10.3, however patient baseline hemoglobin is usually between 11 and 12 -Type and cross and transfuse as clinically indicated -Endoscopist requested hospitalization to monitor for possible ongoing GI bleed -No further rectal/GI bleed at this time  2) alcohol abuse--- admits to daily beer use up to 6 beers at times -High risk for DTs -Lorazepam per CIWA protocol -Multivitamin folic acid onThiamine as ordered  3) hypokalemia-Potassium is 2.9, replace and recheck -Check magnesium  4) status post colonoscopy with polypectomy----upon large polyps one of the greater than 5 cm removed  --awaiting pathology -  5)-social/ethics ----plan of care discussed with patient and patient's nieces [name] and [name] at bedside -Patient remains a full code  6)HTN-stage II hypertension  -Not previously on BP meds okay to start amlodipine 5 mg daily -IV hydralazine as needed elevated BP  Status is: Inpatient  Remains inpatient appropriate because:   Dispo: The patient is from: Home              Anticipated d/c is to: Home              Anticipated d/c date is: 1 day              Patient currently is not medically stable to d/c. Barriers: Not Clinically Stable-   With History of - Reviewed by me  Past Medical History:  Diagnosis Date   Asthma    Pre-diabetes       Past Surgical History:   Procedure Laterality Date   tubilagation  1981   CC-- Gi Bleed     HPI:    Kristine [name]  is a 67 y.o. female with Pmhx relevant for tobacco abuse, etoh abuse who underwent colonoscopy with polypectomy on 07/27/22.....had significant bleeding after polypectomy--- No fever  Or chills   No Nausea, Vomiting or Diarrhea -Patient without prior colonoscopy presented for colonoscopy today -Admits to heavy alcohol and tobacco use at baseline -Additional history obtained from patient's nieces [name] and [name] at bedside - Colonoscopy with polypectomy on 07/27/2022 with findings of One 13 mm polyp in the cecum, removed with a hot snare. Resected and retrieved.-  - One 10 mm polyp in the cecum, removed with a cold snare. Resected and retrieved.  - One greater than 50 mm polyp in the proximal ascending colon-The polyp was multi-lobulated. Appeared pedunculated. 30 mm snare unable  to fit around polyp.  debulked the polyp with hot snare.       Once polyp was small enough to  fit 30 mm snare,  remaining polyp with hot snare at the base of stalk. Polyp    successfully removed.  Large postpolypectomy bleed then ensued. Placed  a clip on post polypectomy site with successful hemostasis. All of the polyp pieces then removed, some with Lucina Mellow net.  - re evaluated post-polypectomy site (approx 15 min after clip placement) and       hemostasis still apparent. - Endoscopist requested hospitalization to monitor for possible ongoing GI bleed -No further rectal/GI bleed at this time -Hgb 14.0 >> 10.3, however patient baseline hemoglobin is usually between 11 and 12 -Potassium is 2.9, creatinine 0.85    Review of systems:    In addition to the HPI above,   A full Review of  Systems was done, all other systems reviewed are negative except as noted above in HPI , .    Social History:  Reviewed by me    Social History   Tobacco Use   Smoking status: Every Day    Packs/day: 1.00    Years: 45.00     Additional pack years: 0.00    Total pack years: 45.00    Types: Cigarettes   Smokeless tobacco: Never  Substance Use Topics   Alcohol use: Yes    Alcohol/week: 14.0 standard drinks of alcohol    Types: 14 Cans of beer per week    Comment: daily     Family History :  Reviewed by me    Family History  Problem Relation Age of Onset   Diabetes type II Mother    Hypertension Mother    Cancer Sister    Hypertension Brother      Home Medications:   Prior to Admission medications   Medication Sig Start Date End Date Taking? Authorizing Provider  albuterol (VENTOLIN HFA) 108 (90 Base) MCG/ACT inhaler Inhale 2 puffs into the lungs every 6 (six) hours as needed for wheezing or shortness of breath. 07/17/22  Yes [name], Tenna Child, FNP  budesonide-formoterol (SYMBICORT) 80-4.5 MCG/ACT inhaler Inhale 2 puffs into the lungs 2 (two) times daily. 07/17/22  Yes [name], Tenna Child, FNP  cholecalciferol (VITAMIN D3) 25 MCG (1000 UNIT) tablet Take 1,000 Units by mouth daily.   Yes [provider]  Omega-3 Fatty Acids (OMEGA 3 PO) Take 1 capsule by mouth daily.   Yes [provider]  rosuvastatin (CRESTOR) 10 MG tablet Take 1 tablet (10 mg total) by mouth daily. 06/29/22  Yes [name], Tenna Child, FNP  traZODone (DESYREL) 50 MG tablet Take 0.5-1 tablets (25-50 mg total) by mouth at bedtime as needed for sleep. 07/20/22   Del Nigel Berthold, FNP     Allergies:     Allergies  Allergen Reactions   Penicillins Anaphylaxis   Sulfa Antibiotics Itching     Physical Exam:   Vitals  Blood pressure 138/84, pulse 61, temperature 98.6 F (37 C), resp. rate 16, height 5\' 2"  (1.575 m), weight 53.5 kg, SpO2 98 %.  Physical Examination: General appearance - alert,  in no distress  Mental status - alert, oriented to person, place, and time,  Eyes - sclera anicteric Neck - supple, no JVD elevation , Chest - clear  to auscultation bilaterally, symmetrical air  movement,  Heart - S1 and S2 normal, regular  Abdomen - soft, non-distended, +BS, mild lower abd discomfort, no rebound or gaurding Neurological - screening mental status exam normal, neck supple without rigidity, cranial nerves II through XII intact, DTR's normal and symmetric  Extremities - no pedal edema noted, intact peripheral pulses  Skin - warm, dry   Data Review:    CBC Recent Labs  Lab 07/27/22 1643  HGB 10.3*  HCT 31.4*   ------------------------------------------------------------------------------------------------------------------  Chemistries  Recent Labs  Lab 07/27/22 1643  NA 137  K 2.9*  CL 101  CO2 26  GLUCOSE 133*  BUN 10  CREATININE 0.95  CALCIUM 9.4   ------------------------------------------------------------------------------------------------------------------ estimated creatinine clearance is 47.3 mL/min (by C-G formula based on SCr of 0.95 mg/dL). ------------------------------------------------------------------------------------------------------------------  Imaging Results:    DVT Prophylaxis -SCD /Gi bleed AM Labs Ordered, also please review Full Orders  Family Communication: Admission, patients condition and plan of care including tests being ordered have been discussed with the patient and nieces [name] and [name] who indicate understanding and agree with the plan   Condition  -stable  Shon Hale M.D on 07/27/2022 at 6:11 PM Go to [email or web address] -  for contact info  [facility] - Office  [phone]

## 2022-07-27 NOTE — Transfer of Care (Signed)
Immediate Anesthesia Transfer of Care Note  Patient: [name]  Procedure(s) Performed: COLONOSCOPY WITH PROPOFOL POLYPECTOMY INTESTINAL HEMOSTASIS CLIP PLACEMENT  Patient Location: PACU  Anesthesia Type:General  Level of Consciousness: awake and patient cooperative  Airway & Oxygen Therapy: Patient Spontanous Breathing and Patient connected to nasal cannula oxygen  Post-op Assessment: Report given to RN and Post -op Vital signs reviewed and stable  Post vital signs: Reviewed and stable  Last Vitals:  Vitals Value Taken Time  BP 137/80 07/27/22 1056  Temp 97.7 07/27/22 1058  Pulse 65 07/27/22 1059  Resp 21 07/27/22 1059  SpO2 100 % 07/27/22 1059  Vitals shown include unvalidated device data.  Last Pain:  Vitals:   07/27/22 0828  TempSrc:   PainSc: 0-No pain      Patients Stated Pain Goal: 5 (07/27/22 0748)  Complications: No notable events documented.

## 2022-07-27 NOTE — Op Note (Signed)
[facility] Patient Name: Kristine [name] Procedure Date: 07/27/2022 8:19 AM MRN: [id number] Date of Birth: [birth year] Attending MD: Hennie Duos. Marletta Lor , [place], [id number] CSN: [id number] Age: 67 Admit Type: Outpatient Procedure:                Colonoscopy Indications:              Screening for colorectal malignant neoplasm Providers:                Hennie Duos. Marletta Lor, DO, Crystal Page, Dyann Ruddle Referring MD:              Medicines:                See the Anesthesia note for documentation of the                            administered medications Complications:            No immediate complications. Estimated Blood Loss:     Estimated blood loss: 250 mL requiring treatment                            with placement of hemostatic clip(s). Procedure:                Pre-Anesthesia Assessment:                           - The anesthesia plan was to use monitored                            anesthesia care (MAC).                           After obtaining informed consent, the colonoscope                            was passed under direct vision. Throughout the                            procedure, the patient's blood pressure, pulse, and                            oxygen saturations were monitored continuously. The                            [id number]-HQ190L ([id number]) scope was introduced through                            the anus and advanced to the the cecum, identified                            by appendiceal orifice and ileocecal valve. The                            colonoscopy was technically difficult and complex  due to a redundant colon and significant looping.                            The patient tolerated the procedure well. The                            quality of the bowel preparation was evaluated                            using the BBPS [facility] Bowel Preparation Scale)                            with scores of: Right Colon = 3, Transverse Colon =                             3 and Left Colon = 3 (entire mucosa seen well with                            no residual staining, small fragments of stool or                            opaque liquid). The total BBPS score equals 9. Scope In: 8:33:58 AM Scope Out: 10:49:21 AM Scope Withdrawal Time: 2 hours 7 minutes 2 seconds  Total Procedure Duration: 2 hours 15 minutes 23 seconds  Findings:      A 13 mm polyp was found in the cecum. The polyp was sessile. The polyp       was removed with a hot snare. Resection and retrieval were complete.      A 10 mm polyp was found in the cecum. The polyp was sessile. The polyp       was removed with a cold snare. Resection and retrieval were complete.      A greater than 50 mm polyp was found in the proximal ascending colon.       The polyp was multi-lobulated. Appeared pedunculated. 30 mm snare unable       to fit around polyp. I then elected to debulk the polyp with hot snare.       Once polyp was small enough to fit 30 mm snare, I then proceeded to       remove the remaining polyp with hot snare at the base of stalk. Polyp       successfully removed. Large postpolypectomy bleed then ensued. I placed       a clip on post polypectomy site with successful hemostasis. All of the       polyp pieces then removed, some with Lucina Mellow net. I re evaluated       post-polypectomy site (approx 15 min after clip placement) and       hemostasis still apparent. Impression:               - One 13 mm polyp in the cecum, removed with a hot                            snare. Resected and retrieved.                           -  One 10 mm polyp in the cecum, removed with a cold                            snare. Resected and retrieved.                           - One greater than 50 mm polyp in the proximal                            ascending colon. Moderate Sedation:      Per Anesthesia Care Recommendation:           - Admit the patient for observation.                            - Check STAT Hgb, transfuse as needed                           - Clear liquid diet                           - Patient will have further washout. If evidence of                            further bleeding, would recommend IR intervention.                           - Await pathology results, further recommendations                            to follow Procedure Code(s):        --- Professional ---                           [phone], Colonoscopy, flexible; with removal of                            tumor(s), polyp(s), or other lesion(s) by snare                            technique Diagnosis Code(s):        --- Professional ---                           Z12.11, Encounter for screening for malignant                            neoplasm of colon                           D12.0, Benign neoplasm of cecum                           D12.2, Benign neoplasm of ascending colon CPT copyright 2022 [employer]. All rights reserved. The codes documented in this report are preliminary and upon coder review may  be revised to meet current compliance requirements.  Hennie Duos. Marletta Lor, DO Hennie Duos. Marletta Lor, DO 07/27/2022 11:00:08 AM This report has been signed electronically. Number of Addenda: 0

## 2022-07-27 NOTE — Anesthesia Postprocedure Evaluation (Signed)
Anesthesia Post Note  Patient: Kristine [name]  Procedure(s) Performed: COLONOSCOPY WITH PROPOFOL POLYPECTOMY INTESTINAL HEMOSTASIS CLIP PLACEMENT  Patient location during evaluation: Phase II Anesthesia Type: General Level of consciousness: awake and alert and oriented Pain management: pain level controlled Vital Signs Assessment: post-procedure vital signs reviewed and stable Respiratory status: spontaneous breathing, nonlabored ventilation and respiratory function stable Cardiovascular status: blood pressure returned to baseline and stable Postop Assessment: [name] apparent nausea or vomiting Anesthetic complications: [name]  [name] notable events documented.   Last Vitals:  Vitals:   07/27/22 1302 07/27/22 1335  BP: (!) 118/93 138/84  Pulse: 60 61  Resp: 11 16  Temp:  37 C  SpO2: 98% 98%    Last Pain:  Vitals:   07/27/22 1403  TempSrc:   PainSc: 0-[name] pain                 Pritika Alvarez C Maxine Huynh

## 2022-07-28 DIAGNOSIS — D62 Acute posthemorrhagic anemia: Secondary | ICD-10-CM

## 2022-07-28 DIAGNOSIS — D122 Benign neoplasm of ascending colon: Secondary | ICD-10-CM | POA: Diagnosis not present

## 2022-07-28 DIAGNOSIS — Z1211 Encounter for screening for malignant neoplasm of colon: Secondary | ICD-10-CM | POA: Diagnosis not present

## 2022-07-28 DIAGNOSIS — K922 Gastrointestinal hemorrhage, unspecified: Secondary | ICD-10-CM | POA: Diagnosis not present

## 2022-07-28 LAB — HEMOGLOBIN AND HEMATOCRIT, BLOOD
HCT: 27.8 % — ABNORMAL LOW (ref 36.0–46.0)
Hemoglobin: 9.1 g/dL — ABNORMAL LOW (ref 12.0–15.0)

## 2022-07-28 LAB — BASIC METABOLIC PANEL
Anion gap: 7 (ref 5–15)
BUN: 10 mg/dL (ref 8–23)
CO2: 23 mmol/L (ref 22–32)
Calcium: 9.1 mg/dL (ref 8.9–10.3)
Chloride: 106 mmol/L (ref 98–111)
Creatinine, Ser: 1.07 mg/dL — ABNORMAL HIGH (ref 0.44–1.00)
GFR, Estimated: 58 mL/min — ABNORMAL LOW (ref >60)
Glucose, Bld: 97 mg/dL (ref 70–99)
Potassium: 4.4 mmol/L (ref 3.5–5.1)
Sodium: 136 mmol/L (ref 135–145)

## 2022-07-28 LAB — CBC
HCT: 26.6 % — ABNORMAL LOW (ref 36.0–46.0)
Hemoglobin: 8.8 g/dL — ABNORMAL LOW (ref 12.0–15.0)
MCH: 31.5 pg (ref 26.0–34.0)
MCHC: 33.1 g/dL (ref 30.0–36.0)
MCV: 95.3 fL (ref 80.0–100.0)
Platelets: 181 10*3/uL (ref 150–400)
RBC: 2.79 MIL/uL — ABNORMAL LOW (ref 3.87–5.11)
RDW: 13.6 % (ref 11.5–15.5)
WBC: 6.9 10*3/uL (ref 4.0–10.5)
nRBC: 0 % (ref 0.0–0.2)

## 2022-07-28 LAB — GLUCOSE, CAPILLARY
Glucose-Capillary: 100 mg/dL — ABNORMAL HIGH (ref 70–99)
Glucose-Capillary: 109 mg/dL — ABNORMAL HIGH (ref 70–99)
Glucose-Capillary: 117 mg/dL — ABNORMAL HIGH (ref 70–99)
Glucose-Capillary: 134 mg/dL — ABNORMAL HIGH (ref 70–99)
Glucose-Capillary: 91 mg/dL (ref 70–99)

## 2022-07-28 MED ORDER — LACTATED RINGERS IV SOLN: INTRAVENOUS | Status: AC

## 2022-07-28 MED ORDER — MAGNESIUM SULFATE 4 GM/100ML IV SOLN
4.0000 g | Freq: Once | INTRAVENOUS | Status: AC
  Administered 2022-07-28: 4 g via INTRAVENOUS
  Filled 2022-07-28: qty 100

## 2022-07-28 NOTE — TOC Initial Note (Signed)
Transition of Care [facility]) - Initial/Assessment Note    Patient Details  Name: Kristine [name] MRN: [id number] Date of Birth: [birth year]  Transition of Care (TOC) CM/SW Contact:    Catalina Gravel, LCSW Phone Number: 07/28/2022, 3:38 PM  Clinical Narrative:                 Pt recent surgical procedure. DC 1-2 days.    Barriers to Discharge: Continued Medical Work up   Patient Goals and CMS Choice            Expected Discharge Plan and Services                                              Prior Living Arrangements/Services                       Activities of Daily Living Home Assistive Devices/Equipment: None ADL Screening (condition at time of admission) Patient's cognitive ability adequate to safely complete daily activities?: Yes Is the patient deaf or have difficulty hearing?: No Does the patient have difficulty seeing, even when wearing glasses/contacts?: No Does the patient have difficulty concentrating, remembering, or making decisions?: No Patient able to express need for assistance with ADLs?: No Does the patient have difficulty dressing or bathing?: Yes Independently performs ADLs?: Yes (appropriate for developmental age) Does the patient have difficulty walking or climbing stairs?: No Weakness of Legs: None Weakness of Arms/Hands: None  Permission Sought/Granted                  Emotional Assessment              Admission diagnosis:  Acute GI bleeding [K92.2] Patient Active Problem List   Diagnosis Date Noted   ABLA (acute blood loss anemia) 07/28/2022   Acute GI bleeding 07/27/2022   HTN (hypertension) 07/27/2022   Colon polyp 07/27/2022   Tobacco abuse 07/27/2022   Alcohol abuse 07/27/2022   Encounter for Papanicolaou smear of cervix 07/20/2022   Insomnia 06/01/2022   Asthma 02/20/2022   PCP:  Rica Records, FNP Pharmacy:   [facility] [address] [place] - [address] [place] [address] Phone: [phone] Fax: [phone]     Social Determinants of Health (SDOH) Social History: SDOH Screenings   Food Insecurity: No Food Insecurity (07/27/2022)  Housing: Low Risk  (07/27/2022)  Transportation Needs: No Transportation Needs (07/27/2022)  Utilities: Not At Risk (07/27/2022)  Depression (PHQ2-9): Low Risk  (07/20/2022)  Tobacco Use: High Risk (07/24/2022)   SDOH Interventions:     Readmission Risk Interventions     No data to display

## 2022-07-28 NOTE — Progress Notes (Signed)
PROGRESS NOTE     Kristine [name], is a 67 y.o. female, DOB - 1955/12/08, ZOX:[id number]  Admit date - 07/27/2022   Admitting Physician Kristine [name] Kristine Clonts, MD  Outpatient Primary MD for the patient is Kristine [name], Tenna Child, FNP  LOS - 0  CC---Gi Bleed      Brief Narrative:  67 y.o. female with Pmhx relevant for tobacco abuse, etoh abuse who underwent colonoscopy with polypectomy on 07/27/22.....had significant bleeding after polypectomy--admitted for further observation    -Assessment and Plan: 1) acute GI bleed post polypectomy/ABLA---  Large postpolypectomy bleed then ensued-after removal of large polyp greater than 5 cm- -hemostasis achieved after clip was placed  baseline hemoglobin is usually between 11 and 12 This admission---Hgb 14.0 >> 10.3>>9.3 >>8.8 -Type and cross and transfuse as clinically indicated -Endoscopist requested hospitalization to monitor for possible ongoing GI bleed -Patient with maroon/mahogany stools- -abdominal discomfort improved -Possible discharge on 07/29/2022 if no significant GI bleed and H&H stabilizes -Will consider CTA abdomen with IR consult if significant GI bleeding occurs again   2) alcohol abuse--- admits to daily beer use up to 6 beers /day PTA -High risk for DTs -Lorazepam per CIWA protocol -Multivitamin folic acid onThiamine as ordered   3) hypokalemia/hypomagnesemia-- -replace   4) status post colonoscopy with polypectomy----upon large polyps one of the greater than 5 cm removed  --awaiting pathology -  5)-social/ethics ----plan of care discussed with patient and patient's nieces Kristine [name] and Kristine [name] at bedside -Patient remains a full code   6)HTN-stage II hypertension  -Not previously on BP meds  -IV hydralazine as needed elevated BP   Status is: Inpatient   Disposition: The patient is from: Home              Anticipated d/c is to: Home              Anticipated d/c date is: 1 day              Patient currently is not  medically stable to d/c. Barriers: Not Clinically Stable-   Code Status : -  Code Status: Full Code   Family Communication:  (patient is alert, awake and coherent)  -discussed with patient and patient's nieces Kristine [name] and Kristine [name] at bedside  DVT Prophylaxis  :   - SCDs   SCDs Start: 07/27/22 1607 Place TED hose Start: 07/27/22 1607   Lab Results  Component Value Date   PLT 181 07/28/2022   Inpatient Medications  Scheduled Meds:  chlordiazePOXIDE  10 mg Oral TID   folic acid  1 mg Oral Daily   mometasone-formoterol  2 puff Inhalation BID   multivitamin with minerals  1 tablet Oral Daily   nicotine  21 mg Transdermal Daily   pantoprazole  40 mg Oral Daily   rosuvastatin  10 mg Oral Daily   sodium chloride flush  3 mL Intravenous Q12H   sodium chloride flush  3 mL Intravenous Q12H   thiamine  100 mg Oral Daily   Or   thiamine  100 mg Intravenous Daily   traZODone  100 mg Oral QHS   Continuous Infusions:  sodium chloride     lactated ringers 50 mL/hr at 07/28/22 0907   PRN Meds:.sodium chloride, acetaminophen **OR** acetaminophen, albuterol, bisacodyl, hydrALAZINE, LORazepam **OR** LORazepam, ondansetron **OR** ondansetron (ZOFRAN) IV, sodium chloride flush   Anti-infectives (From admission, onward)    None       Subjective: [facility] today has no fevers, no emesis,  No chest pain,   -  Patient with maroon/mahogany stools- -abdominal discomfort improving -Patient wants to eat more   Objective: Vitals:   07/28/22 0510 07/28/22 0730 07/28/22 0908 07/28/22 1403  BP: 93/65  109/68 126/71  Pulse: 75  75 74  Resp: 16  17 16   Temp: 98.5 F (36.9 C)   98.4 F (36.9 C)  TempSrc:    Oral  SpO2: 100% 96% 100% 100%  Weight:      Height:        Intake/Output Summary (Last 24 hours) at 07/28/2022 1445 Last data filed at 07/28/2022 0920 Gross per 24 hour  Intake 1080 ml  Output --  Net 1080 ml   Filed Weights   07/27/22 0748  Weight: 53.5 kg    Physical  Exam  Gen:- Awake Alert,  in no apparent distress  HEENT:- [place].AT, No sclera icterus Neck-Supple Neck,No JVD,.  Lungs-  CTAB , fair symmetrical air movement CV- S1, S2 normal, regular  Abd-  +ve B.Sounds, Abd Soft, improving lower abdominal discomfort extremity/Skin:- No  edema, pedal pulses present  Psych-affect is appropriate, oriented x3 Neuro-no new focal deficits, no tremors  Data Reviewed: I have personally reviewed following labs and imaging studies  CBC: Recent Labs  Lab 07/27/22 1643 07/27/22 2242 07/28/22 0409  WBC  --   --  6.9  HGB 10.3* 9.3* 8.8*  HCT 31.4* 28.5* 26.6*  MCV  --   --  95.3  PLT  --   --  181   Basic Metabolic Panel: Recent Labs  Lab 07/27/22 1643 07/27/22 1848 07/28/22 0409  NA 137  --  136  K 2.9*  --  4.4  CL 101  --  106  CO2 26  --  23  GLUCOSE 133*  --  97  BUN 10  --  10  CREATININE 0.95  --  1.07*  CALCIUM 9.4  --  9.1  MG  --  1.4*  --    GFR: Estimated Creatinine Clearance: 42 mL/min (A) (by C-G formula based on SCr of 1.07 mg/dL (H)).  Radiology Studies: No results found. Scheduled Meds:  chlordiazePOXIDE  10 mg Oral TID   folic acid  1 mg Oral Daily   mometasone-formoterol  2 puff Inhalation BID   multivitamin with minerals  1 tablet Oral Daily   nicotine  21 mg Transdermal Daily   pantoprazole  40 mg Oral Daily   rosuvastatin  10 mg Oral Daily   sodium chloride flush  3 mL Intravenous Q12H   sodium chloride flush  3 mL Intravenous Q12H   thiamine  100 mg Oral Daily   Or   thiamine  100 mg Intravenous Daily   traZODone  100 mg Oral QHS   Continuous Infusions:  sodium chloride     lactated ringers 50 mL/hr at 07/28/22 0907    LOS: 0 days   Shon Hale M.D on 07/28/2022 at 2:45 PM  Go to [email or web address] - for contact info  [facility] - Office  [phone]  If 7PM-7AM, please contact night-coverage [email or web address] 07/28/2022, 2:45 PM

## 2022-07-28 NOTE — Progress Notes (Signed)
Subjective: Patient states she feels okay today.  Had a lot of washout yesterday with dark red blood.  States this has slowed.  This morning with 1 small bowel movement.  Hemoglobin dropped to 8.6.  Objective: Vital signs in last 24 hours: Temp:  [98.5 F (36.9 C)-98.8 F (37.1 C)] 98.5 F (36.9 C) (06/15 0510) Pulse Rate:  [55-83] 75 (06/15 0908) Resp:  [7-18] 17 (06/15 0908) BP: (93-165)/(62-93) 109/68 (06/15 0908) SpO2:  [94 %-100 %] 100 % (06/15 0908) Last BM Date : 07/28/22 General:   Alert and oriented, pleasant Head:  Normocephalic and atraumatic. Eyes:  No icterus, sclera clear. Conjuctiva pink.  Abdomen:  Bowel sounds present, soft, non-tender, non-distended. No HSM or hernias noted. No rebound or guarding. No masses appreciated  Msk:  Symmetrical without gross deformities. Normal posture. Extremities:  Without clubbing or edema. Neurologic:  Alert and  oriented x4;  grossly normal neurologically. Skin:  Warm and dry, intact without significant lesions.  Cervical Nodes:  No significant cervical adenopathy. Psych:  Alert and cooperative. Normal mood and affect.  Intake/Output from previous day: 06/14 0701 - 06/15 0700 In: 2240 [P.O.:840; I.V.:1400] Out: 50  Intake/Output this shift: Total I/O In: 240 [P.O.:240] Out: -   Lab Results: Recent Labs    07/27/22 1643 07/27/22 2242 07/28/22 0409  WBC  --   --  6.9  HGB 10.3* 9.3* 8.8*  HCT 31.4* 28.5* 26.6*  PLT  --   --  181   BMET Recent Labs    07/27/22 1643 07/28/22 0409  NA 137 136  K 2.9* 4.4  CL 101 106  CO2 26 23  GLUCOSE 133* 97  BUN 10 10  CREATININE 0.95 1.07*  CALCIUM 9.4 9.1   LFT No results for input(s): "PROT", "ALBUMIN", "AST", "ALT", "ALKPHOS", "BILITOT", "BILIDIR", "IBILI" in the last 72 hours. PT/INR No results for input(s): "LABPROT", "INR" in the last 72 hours. Hepatitis Panel No results for input(s): "HEPBSAG", "HCVAB", "HEPAIGM", "HEPBIGM" in the last 72  hours.   Studies/Results: No results found.  Assessment: *Acute post polypectomy bleed *Acute blood loss anemia due to above  Plan: Patient underwent routine screening colonoscopy 07/27/2022, found to have 5 cm semipedunculated polyp in her proximal ascending colon status post polypectomy.  Subsequent post polypectomy bleed then ensued with successful hemostasis with clip placement.  Will continue to monitor today.  Her bleeding seems to have decreased substantially.  Likely a lot of what we saw yesterday was washout from the procedure itself.  Continue monitor H&H and transfuse for less than 7.  If she has evidence of further bleeding, would recommend stat CT angiogram abdomen and pelvis and possible IR intervention.  I will follow-up on pathology results and discuss next steps with patient.  Otherwise supportive care.  Okay to start on regular diet today.  Hennie Duos. Marletta Lor, D.O. Gastroenterology and Hepatology [facility]   LOS: 0 days    07/28/2022, 11:58 AM

## 2022-07-28 NOTE — Progress Notes (Addendum)
Patient is complaining of some pain on the right side of her abdomen. MD Emokpae notified. Tylenol given.

## 2022-07-28 NOTE — Plan of Care (Signed)
  Problem: Clinical Measurements: Goal: Ability to maintain clinical measurements within normal limits will improve Outcome: Progressing   Problem: Activity: Goal: Risk for activity intolerance will decrease Outcome: Progressing   Problem: Pain Managment: Goal: General experience of comfort will improve Outcome: Progressing   Problem: Safety: Goal: Ability to remain free from injury will improve Outcome: Progressing   

## 2022-07-29 DIAGNOSIS — K922 Gastrointestinal hemorrhage, unspecified: Secondary | ICD-10-CM | POA: Diagnosis not present

## 2022-07-29 DIAGNOSIS — D62 Acute posthemorrhagic anemia: Secondary | ICD-10-CM | POA: Diagnosis not present

## 2022-07-29 DIAGNOSIS — Z1211 Encounter for screening for malignant neoplasm of colon: Secondary | ICD-10-CM | POA: Diagnosis not present

## 2022-07-29 LAB — CBC
HCT: 26.4 % — ABNORMAL LOW (ref 36.0–46.0)
Hemoglobin: 8.7 g/dL — ABNORMAL LOW (ref 12.0–15.0)
MCH: 31.3 pg (ref 26.0–34.0)
MCHC: 33 g/dL (ref 30.0–36.0)
MCV: 95 fL (ref 80.0–100.0)
Platelets: 169 10*3/uL (ref 150–400)
RBC: 2.78 MIL/uL — ABNORMAL LOW (ref 3.87–5.11)
RDW: 13.3 % (ref 11.5–15.5)
WBC: 7 10*3/uL (ref 4.0–10.5)
nRBC: 0 % (ref 0.0–0.2)

## 2022-07-29 LAB — GLUCOSE, CAPILLARY: Glucose-Capillary: 212 mg/dL — ABNORMAL HIGH (ref 70–99)

## 2022-07-29 MED ORDER — ACETAMINOPHEN 325 MG PO TABS: 650.0000 mg | ORAL_TABLET | Freq: Four times a day (QID) | ORAL | 0 refills | Status: AC | PRN

## 2022-07-29 MED ORDER — ROSUVASTATIN CALCIUM 10 MG PO TABS
10.0000 mg | ORAL_TABLET | Freq: Every day | ORAL | 3 refills | Status: DC
Start: 2022-07-29 — End: 2023-11-12

## 2022-07-29 MED ORDER — BUDESONIDE-FORMOTEROL FUMARATE 80-4.5 MCG/ACT IN AERO
2.0000 | INHALATION_SPRAY | Freq: Two times a day (BID) | RESPIRATORY_TRACT | 3 refills | Status: DC
Start: 2022-07-29 — End: 2022-09-25

## 2022-07-29 MED ORDER — PANTOPRAZOLE SODIUM 40 MG PO TBEC: 40.0000 mg | DELAYED_RELEASE_TABLET | Freq: Every day | ORAL | 4 refills | Status: DC

## 2022-07-29 MED ORDER — TRAZODONE HCL 50 MG PO TABS
25.0000 mg | ORAL_TABLET | Freq: Every evening | ORAL | 3 refills | Status: AC | PRN
Start: 2022-07-29 — End: ?

## 2022-07-29 MED ORDER — NICOTINE 21 MG/24HR TD PT24: 21.0000 mg | MEDICATED_PATCH | Freq: Every day | TRANSDERMAL | 0 refills | Status: DC

## 2022-07-29 MED ORDER — CHLORDIAZEPOXIDE HCL 5 MG PO CAPS: 5.0000 mg | ORAL_CAPSULE | ORAL | 0 refills | Status: DC

## 2022-07-29 MED ORDER — ALBUTEROL SULFATE HFA 108 (90 BASE) MCG/ACT IN AERS
2.0000 | INHALATION_SPRAY | Freq: Four times a day (QID) | RESPIRATORY_TRACT | 2 refills | Status: AC | PRN
Start: 2022-07-29 — End: ?

## 2022-07-29 NOTE — Progress Notes (Signed)
Subjective: Patient states she feels well today.  No further bleeding.  Some right-sided abdominal pain.  Mild.  Tolerating diet.  Wishes to go home.  Objective: Vital signs in last 24 hours: Temp:  [98.4 F (36.9 C)-98.7 F (37.1 C)] 98.6 F (37 C) (06/16 0426) Pulse Rate:  [72-75] 72 (06/16 0426) Resp:  [16-18] 16 (06/16 0426) BP: (117-128)/(71-75) 128/75 (06/16 0426) SpO2:  [96 %-100 %] 96 % (06/16 0750) Last BM Date : 07/28/22 General:   Alert and oriented, pleasant Head:  Normocephalic and atraumatic. Eyes:  No icterus, sclera clear. Conjuctiva pink.  Abdomen:  Bowel sounds present, soft, non-tender, non-distended. No HSM or hernias noted. No rebound or guarding. No masses appreciated  Msk:  Symmetrical without gross deformities. Normal posture. Extremities:  Without clubbing or edema. Neurologic:  Alert and  oriented x4;  grossly normal neurologically. Skin:  Warm and dry, intact without significant lesions.  Cervical Nodes:  No significant cervical adenopathy. Psych:  Alert and cooperative. Normal mood and affect.  Intake/Output from previous day: 06/15 0701 - 06/16 0700 In: 1304.2 [P.O.:960; I.V.:344.2] Out: -  Intake/Output this shift: No intake/output data recorded.  Lab Results: Recent Labs    07/28/22 0409 07/28/22 1656 07/29/22 0518  WBC 6.9  --  7.0  HGB 8.8* 9.1* 8.7*  HCT 26.6* 27.8* 26.4*  PLT 181  --  169   BMET Recent Labs    07/27/22 1643 07/28/22 0409  NA 137 136  K 2.9* 4.4  CL 101 106  CO2 26 23  GLUCOSE 133* 97  BUN 10 10  CREATININE 0.95 1.07*  CALCIUM 9.4 9.1   LFT No results for input(s): "PROT", "ALBUMIN", "AST", "ALT", "ALKPHOS", "BILITOT", "BILIDIR", "IBILI" in the last 72 hours. PT/INR No results for input(s): "LABPROT", "INR" in the last 72 hours. Hepatitis Panel No results for input(s): "HEPBSAG", "HCVAB", "HEPAIGM", "HEPBIGM" in the last 72 hours.   Studies/Results: No results found.  Assessment: *Acute post  polypectomy bleed *Acute blood loss anemia due to above  Plan: Patient underwent routine screening colonoscopy 07/27/2022, found to have 5 cm semipedunculated polyp in her proximal ascending colon status post polypectomy.  Subsequent post polypectomy bleed then ensued with successful hemostasis with clip placement.  No further bleeding since small amount yesterday morning.  I think she is stable for discharge today.  I did discuss with her given the large size of her polyp removed, she could have further bleeding.  She will monitor.    I will follow-up on pathology results and discuss next steps with patient.    Hennie Duos. Marletta Lor, D.O. Gastroenterology and Hepatology [facility]   LOS: 0 days    07/29/2022, 12:00 PM

## 2022-07-29 NOTE — Discharge Instructions (Signed)
1)Complete Abstinence from Alcohol advised--please consider outpatient or inpatient alcohol rehab program 2)-Librium will help you quit drinking alcohol-- Please take Librium (CHLORDIAZEPOXIDE HCL CAPS) Take 1 Capsule 3 times a day for 2 days, then take 1 cap 2 times a day for 2 days, then 1 cap daily x 2 days , then STOP----this will Help You Quit Drinking Alcohol.... - Please do Not drink alcohol while taking this medication 3)Complete  Abstinence from Tobacco advised--- nicotine patch will help you quit smoking 4)Repeat CBC blood test with the primary care physician within a Wednesday, 08/01/2022 advised 5)Follow-up Gastroenterologist Dr. Earnest Bailey with [facility]--in about 1-2 weeks or so for evaluation  -address: [address] [place], [place] [address], Phone: [phone]

## 2022-07-29 NOTE — Discharge Summary (Signed)
Kristine [name], is a 67 y.o. female  DOB [birth year]  MRN [id number].  Admission date:  07/27/2022  Admitting Physician  Saad Buhl Mariea Clonts, MD  Discharge Date:  07/29/2022   Primary MD  Del Nigel Berthold, FNP  Recommendations for primary care physician for things to follow:   1)Complete Abstinence from Alcohol advised--please consider outpatient or inpatient alcohol rehab program 2)-Librium will help you quit drinking alcohol-- Please take Librium (CHLORDIAZEPOXIDE HCL CAPS) Take 1 Capsule 3 times a day for 2 days, then take 1 cap 2 times a day for 2 days, then 1 cap daily x 2 days , then STOP----this will Help You Quit Drinking Alcohol.... - Please do Not drink alcohol while taking this medication 3)Complete  Abstinence from Tobacco advised--- nicotine patch will help you quit smoking 4)Repeat CBC blood test with the primary care physician within a Wednesday, 08/01/2022 advised 5)Follow-up Gastroenterologist Dr. Earnest Bailey with [facility]--in about 1-2 weeks or so for evaluation  -address: [address] [place], [place] [address], Phone: [phone]  Admission Diagnosis  Acute GI bleeding [K92.2]   Discharge Diagnosis  Acute GI bleeding [K92.2]    Principal Problem:   Acute GI bleeding Active Problems:   HTN (hypertension)   Colon polyp   Tobacco abuse   Alcohol abuse   ABLA (acute blood loss anemia)      Past Medical History:  Diagnosis Date   Asthma    Pre-diabetes     Past Surgical History:  Procedure Laterality Date   tubilagation  1981     HPI  from the history and physical done on the day of admission:   Kristine [name]  is a 67 y.o. female with Pmhx relevant for tobacco abuse, etoh abuse who underwent colonoscopy with polypectomy on 07/27/22.....had significant bleeding after polypectomy--- No fever  Or chills    No Nausea, Vomiting or  Diarrhea -Patient without prior colonoscopy presented for colonoscopy today -Admits to heavy alcohol and tobacco use at baseline -Additional history obtained from patient's nieces [name] and [name] at bedside - Colonoscopy with polypectomy on 07/27/2022 with findings of One 13 mm polyp in the cecum, removed with a hot snare. Resected and retrieved.-  - One 10 mm polyp in the cecum, removed with a cold snare. Resected and retrieved.  - One greater than 50 mm polyp in the proximal ascending colon-The polyp was multi-lobulated. Appeared pedunculated. 30 mm snare unable  to fit around polyp.  debulked the polyp with hot snare.       Once polyp was small enough to fit 30 mm snare,  remaining polyp with hot snare at the base of stalk. Polyp    successfully removed.  Large postpolypectomy bleed then ensued. Placed  a clip on post polypectomy site with successful hemostasis. All of the polyp pieces then removed, some with Lucina Mellow net.  - re evaluated post-polypectomy site (approx 15 min after clip placement) and       hemostasis still apparent. - Endoscopist requested hospitalization to monitor for  possible ongoing GI bleed -No further rectal/GI bleed at this time -Hgb 14.0 >> 10.3, however patient baseline hemoglobin is usually between 11 and 12 -Potassium is 2.9, creatinine 0.85   Hospital Course:   1)Acute GI bleed post polypectomy/ABLA---  Large postpolypectomy bleed then ensued-after removal of large polyp greater than 5 cm- -hemostasis achieved after clip was placed -Hgb 14.0 >> 10.3, however patient baseline hemoglobin is usually between 11 and 12 -Hgb is down to 8.7 discharge--stable over the last 24 hours -GI team okay to discharge home with outpatient follow-up   2) alcohol abuse--- admits to daily beer use up to 6 beers at times -No DTs at this time -We treated patient with lorazepam per CIWA protocol -We also gave multivitamin folic acid onThiamine   -Discharge on Librium taper   3)  hypokalemia/hypomagnesemia-due to GI losses in setting of colonoscopy prep compounded by alcohol abuse,  -Electrolytes replaced  4) status post colonoscopy with polypectomy----upon large polyps one of the greater than 5 cm removed  -Follow-up with GI team for pathology results -   5)-social/ethics ----plan of care discussed with patient and patient's nieces [name] and [name] at bedside -Patient remains a full code   6) elevated BP -Not previously on BP meds - -treated with  amlodipine  -Follow-up with PCP for recheck may need chronic antihypertensive agents     Dispo: The patient is from: Home              Anticipated d/c is to: Home  Discharge Condition: stable  Follow UP   Follow-up Information     Del Nigel Berthold, FNP Follow up on 08/01/2022.   Specialty: Family Medicine Why: Repeat CBC Blood test Contact information: [address] [place] [address] [phone]         Lanelle Bal, DO. Schedule an appointment as soon as possible for a visit in 1 week(s).   Specialty: Gastroenterology Contact information: [address] [place] [address] [phone]                  Consults obtained - Gi  Diet and Activity recommendation:  As advised  Discharge Instructions    Discharge Instructions     Ambulatory Referral for Lung Cancer Scre   Complete by: As directed    Call MD for:  difficulty breathing, headache or visual disturbances   Complete by: As directed    Call MD for:  persistant nausea and vomiting   Complete by: As directed    Call MD for:  temperature >100.4   Complete by: As directed    Diet - low sodium heart healthy   Complete by: As directed    Discharge instructions   Complete by: As directed    1)Complete Abstinence from Alcohol advised--please consider outpatient or inpatient alcohol rehab program 2)-Librium will help you quit drinking alcohol-- Please take Librium (CHLORDIAZEPOXIDE HCL CAPS) Take 1  Capsule 3 times a day for 2 days, then take 1 cap 2 times a day for 2 days, then 1 cap daily x 2 days , then STOP----this will Help You Quit Drinking Alcohol.... - Please do Not drink alcohol while taking this medication 3)Complete  Abstinence from Tobacco advised--- nicotine patch will help you quit smoking 4)Repeat CBC blood test with the primary care physician within a Wednesday, 08/01/2022 advised 5)Follow-up Gastroenterologist Dr. Earnest Bailey with [facility]--in about 1-2 weeks or so for evaluation  -address: [address], [place], [place] [address],  Phone: [phone]   Increase activity slowly   Complete by: As directed          Discharge Medications     Allergies as of 07/29/2022       Reactions   Penicillins Anaphylaxis   Sulfa Antibiotics Itching        Medication List     TAKE these medications    acetaminophen 325 MG tablet Commonly known as: TYLENOL Take 2 tablets (650 mg total) by mouth every 6 (six) hours as needed for mild pain, fever or headache (or Fever >/= 101).   albuterol 108 (90 Base) MCG/ACT inhaler Commonly known as: VENTOLIN HFA Inhale 2 puffs into the lungs every 6 (six) hours as needed for wheezing or shortness of breath.   budesonide-formoterol 80-4.5 MCG/ACT inhaler Commonly known as: SYMBICORT Inhale 2 puffs into the lungs 2 (two) times daily.   chlordiazePOXIDE 5 MG capsule Commonly known as: LIBRIUM Take 1 capsule (5 mg total) by mouth See admin instructions. Take Librium (CHLORDIAZEPOXIDE HCL CAPS) Take 1 Capsule 3 times a day for 2 days, then take 1 cap 2 times a day for 2 days, then 1 cap daily x 2 days , then STOP----this will Help You Quit Drinking Alcohol.... - Please do Not drink alcohol while taking this medication   cholecalciferol 25 MCG (1000 UNIT) tablet Commonly known as: VITAMIN D3 Take 1,000 Units by mouth daily.   nicotine 21 mg/24hr patch Commonly known as: NICODERM CQ - dosed in mg/24  hours Place 1 patch (21 mg total) onto the skin daily. Start taking on: July 30, 2022   OMEGA 3 PO Take 1 capsule by mouth daily.   pantoprazole 40 MG tablet Commonly known as: PROTONIX Take 1 tablet (40 mg total) by mouth daily. Start taking on: July 30, 2022   rosuvastatin 10 MG tablet Commonly known as: Crestor Take 1 tablet (10 mg total) by mouth daily.   traZODone 50 MG tablet Commonly known as: DESYREL Take 0.5-1 tablets (25-50 mg total) by mouth at bedtime as needed for sleep.        Today   Subjective    Kristine [name] today has no new complaints -Eating and drinking well -No vomiting or diarrhea -Brown stool          Patient has been seen and examined prior to discharge   Objective   Blood pressure 128/75, pulse 72, temperature 98.6 F (37 C), temperature source Oral, resp. rate 16, height 5\' 2"  (1.575 m), weight 53.5 kg, SpO2 96 %.   Intake/Output Summary (Last 24 hours) at 07/29/2022 1134 Last data filed at 07/28/2022 2202 Gross per 24 hour  Intake 1064.17 ml  Output --  Net 1064.17 ml    Exam Gen:- Awake Alert, no acute distress  HEENT:- [place].AT, No sclera icterus Neck-Supple Neck,No JVD,.  Lungs-  CTAB , good air movement bilaterally CV- S1, S2 normal, regular Abd-  +ve B.Sounds, Abd Soft, No tenderness,    Extremity/Skin:- No  edema,   good pulses Psych-affect is appropriate, oriented x3 Neuro-no new focal deficits, no tremors    Data Review   CBC w Diff:  Lab Results  Component Value Date   WBC 7.0 07/29/2022   HGB 8.7 (L) 07/29/2022   HGB 14.0 06/01/2022   HCT 26.4 (L) 07/29/2022   HCT 41.4 06/01/2022   PLT 169 07/29/2022   PLT 267 06/01/2022   LYMPHOPCT 15 02/02/2022   MONOPCT 9 02/02/2022   EOSPCT 0 02/02/2022  BASOPCT 0 02/02/2022    CMP:  Lab Results  Component Value Date   NA 136 07/28/2022   NA 140 06/01/2022   K 4.4 07/28/2022   CL 106 07/28/2022   CO2 23 07/28/2022   BUN 10 07/28/2022   BUN 16 06/01/2022    CREATININE 1.07 (H) 07/28/2022   PROT 8.4 06/01/2022   ALBUMIN 4.7 06/01/2022   BILITOT 1.0 06/01/2022   ALKPHOS 97 06/01/2022   AST 33 06/01/2022   ALT 20 06/01/2022  .  Total Discharge time is about 33 minutes  Shon Hale M.D on 07/29/2022 at 11:34 AM  Go to [email or web address] -  for contact info  [facility] - Office  [phone]

## 2022-07-29 NOTE — Progress Notes (Addendum)
Discharge instructions given patient verbalized understanding. Awaiting ride to arrive from [employer]

## 2022-07-30 ENCOUNTER — Telehealth: Payer: Self-pay

## 2022-07-30 LAB — CYTOLOGY - PAP
Comment: NEGATIVE
Diagnosis: NEGATIVE
High risk HPV: NEGATIVE

## 2022-07-30 LAB — SURGICAL PATHOLOGY

## 2022-07-30 NOTE — Transitions of Care (Post Inpatient/ED Visit) (Signed)
07/30/2022  Name: Kristine [name] MRN: [id number] DOB: [birth year]  Today's TOC FU Call Status: Today's TOC FU Call Status:: Successful TOC FU Call Competed TOC FU Call Complete Date: 07/30/22  Transition Care Management Follow-up Telephone Call Date of Discharge: 07/29/22 [facility] Type of Discharge: Inpatient Admission Primary Inpatient Discharge Diagnosis:: GI bleed How have you been since you were released from the hospital?: Better Any questions or concerns?: No  Items Reviewed: Did you receive and understand the discharge instructions provided?: Yes Medications obtained,verified, and reconciled?: Yes (Medications Reviewed) Any new allergies since your discharge?: No Do you have support at home?: Yes People in Home: other relative(s)  Medications Reviewed Today: Medications Reviewed Today     Reviewed by Karena Addison, LPN (Licensed Practical Nurse) on 07/30/22 at 1253  Med List Status: <None>   Medication Order Taking? Sig Documenting Provider Last Dose Status Informant  acetaminophen (TYLENOL) 325 MG tablet [id number]  Take 2 tablets (650 mg total) by mouth every 6 (six) hours as needed for mild pain, fever or headache (or Fever >/= 101). Shon Hale, MD  Active   albuterol (VENTOLIN HFA) 108 (90 Base) MCG/ACT inhaler [id number]  Inhale 2 puffs into the lungs every 6 (six) hours as needed for wheezing or shortness of breath. Shon Hale, MD  Active   budesonide-formoterol [facility]) 80-4.5 MCG/ACT inhaler [id number]  Inhale 2 puffs into the lungs 2 (two) times daily. Shon Hale, MD  Active   chlordiazePOXIDE (LIBRIUM) 5 MG capsule [id number]  Take 1 capsule (5 mg total) by mouth See admin instructions. Take Librium (CHLORDIAZEPOXIDE HCL CAPS) Take 1 Capsule 3 times a day for 2 days, then take 1 cap 2 times a day for 2 days, then 1 cap daily x 2 days , then STOP----this will Help You Quit Drinking Alcohol.... - Please do Not drink  alcohol while taking this medication Emokpae, Courage, MD  Active   cholecalciferol (VITAMIN D3) 25 MCG (1000 UNIT) tablet [id number]  Take 1,000 Units by mouth daily. [provider]  Active Self  nicotine (NICODERM CQ - DOSED IN MG/24 HOURS) 21 mg/24hr patch [id number]  Place 1 patch (21 mg total) onto the skin daily. Shon Hale, MD  Active   Omega-3 Fatty Acids (OMEGA 3 PO) [id number] No Take 1 capsule by mouth daily. [provider] 07/26/2022 Active Self  pantoprazole (PROTONIX) 40 MG tablet [id number]  Take 1 tablet (40 mg total) by mouth daily. Shon Hale, MD  Active   rosuvastatin (CRESTOR) 10 MG tablet [id number]  Take 1 tablet (10 mg total) by mouth daily. Shon Hale, MD  Active   traZODone (DESYREL) 50 MG tablet [id number]  Take 0.5-1 tablets (25-50 mg total) by mouth at bedtime as needed for sleep. Shon Hale, MD  Active             Home Care and Equipment/Supplies: Were Home Health Services Ordered?: NA Any new equipment or medical supplies ordered?: NA  Functional Questionnaire: Do you need assistance with bathing/showering or dressing?: No Do you need assistance with meal preparation?: No Do you need assistance with eating?: No Do you have difficulty maintaining continence: No Do you need assistance with getting out of bed/getting out of a chair/moving?: No Do you have difficulty managing or taking your medications?: No  Follow up appointments reviewed: PCP Follow-up appointment confirmed?: Yes Date of PCP follow-up appointment?: 08/10/22 Follow-up Provider: Lonna Cobb [facility] Follow-up appointment confirmed?: No Reason Specialist Follow-Up Not Confirmed: Patient has  Specialist Provider Number and will Call for Appointment Do you need transportation to your follow-up appointment?: No Do you understand care options if your condition(s) worsen?: Yes-patient verbalized understanding    SIGNATURE Karena Addison,  LPN [facility] Nurse Health Advisor Direct Dial [phone]

## 2022-08-01 ENCOUNTER — Other Ambulatory Visit: Payer: Self-pay

## 2022-08-01 DIAGNOSIS — E785 Hyperlipidemia, unspecified: Secondary | ICD-10-CM

## 2022-08-02 LAB — CBC WITH DIFFERENTIAL/PLATELET
Basophils Absolute: 0 10*3/uL (ref 0.0–0.2)
Basos: 0 %
EOS (ABSOLUTE): 0.1 10*3/uL (ref 0.0–0.4)
Eos: 1 %
Hematocrit: 27 % — ABNORMAL LOW (ref 34.0–46.6)
Hemoglobin: 9 g/dL — ABNORMAL LOW (ref 11.1–15.9)
Immature Grans (Abs): 0 10*3/uL (ref 0.0–0.1)
Immature Granulocytes: 0 %
Lymphocytes Absolute: 2.2 10*3/uL (ref 0.7–3.1)
Lymphs: 32 %
MCH: 30.9 pg (ref 26.6–33.0)
MCHC: 33.3 g/dL (ref 31.5–35.7)
MCV: 93 fL (ref 79–97)
Monocytes Absolute: 0.5 10*3/uL (ref 0.1–0.9)
Monocytes: 8 %
Neutrophils Absolute: 4 10*3/uL (ref 1.4–7.0)
Neutrophils: 59 %
Platelets: 254 10*3/uL (ref 150–450)
RBC: 2.91 x10E6/uL — ABNORMAL LOW (ref 3.77–5.28)
RDW: 12.9 % (ref 11.7–15.4)
WBC: 6.8 10*3/uL (ref 3.4–10.8)

## 2022-08-04 ENCOUNTER — Other Ambulatory Visit: Payer: Self-pay | Admitting: Family Medicine

## 2022-08-04 MED ORDER — METRONIDAZOLE 500 MG PO TABS: 500.0000 mg | ORAL_TABLET | Freq: Two times a day (BID) | ORAL | 0 refills | Status: AC

## 2022-08-04 NOTE — Progress Notes (Signed)
Please inform patient, Pap smear results negative , next pap smear in 3 years. Positive for trichomonas plan of treatment will include  metronidazole 500 mg twice daily for 7 days.

## 2022-08-06 ENCOUNTER — Other Ambulatory Visit: Payer: Self-pay

## 2022-08-06 DIAGNOSIS — K922 Gastrointestinal hemorrhage, unspecified: Secondary | ICD-10-CM

## 2022-08-10 ENCOUNTER — Encounter: Payer: Self-pay | Admitting: Family Medicine

## 2022-08-10 ENCOUNTER — Ambulatory Visit (INDEPENDENT_AMBULATORY_CARE_PROVIDER_SITE_OTHER): Payer: Medicare Other | Admitting: Family Medicine

## 2022-08-10 VITALS — BP 142/86 | HR 72 | Ht 62.0 in | Wt 124.1 lb

## 2022-08-10 DIAGNOSIS — M810 Age-related osteoporosis without current pathological fracture: Secondary | ICD-10-CM

## 2022-08-10 DIAGNOSIS — I10 Essential (primary) hypertension: Secondary | ICD-10-CM

## 2022-08-10 MED ORDER — AMLODIPINE BESYLATE 5 MG PO TABS
5.0000 mg | ORAL_TABLET | Freq: Every day | ORAL | 1 refills | Status: DC
Start: 2022-08-10 — End: 2022-10-12

## 2022-08-10 MED ORDER — FLUCONAZOLE 150 MG PO TABS: 150.0000 mg | ORAL_TABLET | Freq: Once | ORAL | 0 refills | Status: AC

## 2022-08-10 NOTE — Assessment & Plan Note (Signed)
Vitals:   08/10/22 1105 08/10/22 1116  BP: (!) 146/93 (!) 142/86   Blood pressure not controlled in today's visit Started amlodipine 5 mg daily Follow up in 6 weeks Continued discussion on DASH diet, low sodium diet and maintain a exercise routine for 150 minutes per week.

## 2022-08-10 NOTE — Patient Instructions (Signed)

## 2022-08-10 NOTE — Progress Notes (Signed)
Patient Office Visit   Subjective   Patient ID: Kristine [name], female    DOB: [birth year]  Age: 67 y.o. MRN: [id number]  CC:  Chief Complaint  Patient presents with   Hypertension    2 week f/u for bp check. Readings have been in the 140s over 90's     [name] 67 year old female, presents to the clinic for HTN follow up She  has a past medical history of Asthma and Pre-diabetes.For the details of today's visit, please refer to assessment and plan.   HPI    Outpatient Encounter Medications as of 08/10/2022  Medication Sig   acetaminophen (TYLENOL) 325 MG tablet Take 2 tablets (650 mg total) by mouth every 6 (six) hours as needed for mild pain, fever or headache (or Fever >/= 101).   albuterol (VENTOLIN HFA) 108 (90 Base) MCG/ACT inhaler Inhale 2 puffs into the lungs every 6 (six) hours as needed for wheezing or shortness of breath.   budesonide-formoterol (SYMBICORT) 80-4.5 MCG/ACT inhaler Inhale 2 puffs into the lungs 2 (two) times daily.   chlordiazePOXIDE (LIBRIUM) 5 MG capsule Take 1 capsule (5 mg total) by mouth See admin instructions. Take Librium (CHLORDIAZEPOXIDE HCL CAPS) Take 1 Capsule 3 times a day for 2 days, then take 1 cap 2 times a day for 2 days, then 1 cap daily x 2 days , then STOP----this will Help You Quit Drinking Alcohol.... - Please do Not drink alcohol while taking this medication   cholecalciferol (VITAMIN D3) 25 MCG (1000 UNIT) tablet Take 1,000 Units by mouth daily.   metroNIDAZOLE (FLAGYL) 500 MG tablet Take 1 tablet (500 mg total) by mouth 2 (two) times daily for 7 days.   nicotine (NICODERM CQ - DOSED IN MG/24 HOURS) 21 mg/24hr patch Place 1 patch (21 mg total) onto the skin daily.   Omega-3 Fatty Acids (OMEGA 3 PO) Take 1 capsule by mouth daily.   pantoprazole (PROTONIX) 40 MG tablet Take 1 tablet (40 mg total) by mouth daily.   rosuvastatin (CRESTOR) 10 MG tablet Take 1 tablet (10 mg total) by mouth daily.   traZODone (DESYREL) 50 MG tablet  Take 0.5-1 tablets (25-50 mg total) by mouth at bedtime as needed for sleep.   No facility-administered encounter medications on file as of 08/10/2022.    Past Surgical History:  Procedure Laterality Date   tubilagation  1981    Review of Systems  Constitutional:  Negative for chills and fever.  Gastrointestinal:  Negative for abdominal pain.  Neurological:  Negative for dizziness.      Objective    BP (!) 142/86 (BP Location: Left Arm)   Pulse 72   Ht 5\' 2"  (1.575 m)   Wt 124 lb 1.9 oz (56.3 kg)   SpO2 93%   BMI 22.70 kg/m   Physical Exam Vitals reviewed.  Constitutional:      General: She is not in acute distress.    Appearance: Normal appearance. She is not ill-appearing, toxic-appearing or diaphoretic.  HENT:     Head: Normocephalic.  Eyes:     General:        Right eye: No discharge.        Left eye: No discharge.     Conjunctiva/sclera: Conjunctivae normal.  Cardiovascular:     Rate and Rhythm: Normal rate.     Pulses: Normal pulses.     Heart sounds: Normal heart sounds.  Pulmonary:     Effort: Pulmonary effort is normal.  No respiratory distress.     Breath sounds: Normal breath sounds.  Musculoskeletal:        General: Normal range of motion.     Cervical back: Normal range of motion.  Skin:    General: Skin is warm and dry.     Capillary Refill: Capillary refill takes less than 2 seconds.  Neurological:     General: No focal deficit present.     Mental Status: She is alert and oriented to person, place, and time.     Coordination: Coordination normal.     Gait: Gait normal.  Psychiatric:        Mood and Affect: Mood normal.       Assessment & Plan:  Primary hypertension Assessment & Plan: Vitals:   08/10/22 1105 08/10/22 1116  BP: (!) 146/93 (!) 142/86   Blood pressure not controlled in today's visit Started amlodipine 5 mg daily Follow up in 6 weeks Continued discussion on DASH diet, low sodium diet and maintain a exercise routine for 150  minutes per week.    Osteoporosis screening    Return in about 6 weeks (around 09/21/2022) for hypertension, re-check blood pressure.   Cruzita Lederer Newman Nip, FNP

## 2022-08-17 ENCOUNTER — Encounter (HOSPITAL_COMMUNITY): Payer: Self-pay | Admitting: Internal Medicine

## 2022-08-20 ENCOUNTER — Ambulatory Visit: Payer: BLUE CROSS/BLUE SHIELD | Admitting: Family Medicine

## 2022-09-19 ENCOUNTER — Ambulatory Visit (INDEPENDENT_AMBULATORY_CARE_PROVIDER_SITE_OTHER): Payer: Medicare Other | Admitting: Internal Medicine

## 2022-09-19 ENCOUNTER — Encounter: Payer: Self-pay | Admitting: Internal Medicine

## 2022-09-19 VITALS — BP 147/91 | HR 75 | Temp 97.3°F | Ht 62.0 in | Wt 119.7 lb

## 2022-09-19 DIAGNOSIS — I10 Essential (primary) hypertension: Secondary | ICD-10-CM

## 2022-09-19 DIAGNOSIS — D126 Benign neoplasm of colon, unspecified: Secondary | ICD-10-CM

## 2022-09-19 DIAGNOSIS — D508 Other iron deficiency anemias: Secondary | ICD-10-CM

## 2022-09-19 NOTE — Patient Instructions (Signed)
We will schedule you for colonoscopy to ensure that the polyp was fully removed.  If this colonoscopy looks good then we will stretch out to 3 years.  It was very nice seeing both you again today.  Dr. Marletta Lor

## 2022-09-19 NOTE — Progress Notes (Signed)
Referring Provider: Wylene Men* Primary Care Physician:  Rica Records, FNP Primary GI:  Dr. Marletta Lor  Chief Complaint  Patient presents with   Follow-up    Patient here today for a follow up from her 07/27/2022 TCS. Patient says she had some complications during the procedure with bleeding during the removal of a polyp. Patient denies any current gi issues.     HPI:   Kristine [name] is a 67 y.o. female who presents to the clinic today for follow-up visit.  Underwent screening colonoscopy 07/27/2022, found to have a very large polyp in the ascending colon requiring in depth resection.  Complicated by post polypectomy bleed.  She was admitted to observation to the hospital and eventually discharged.  2 other polyps removed from the cecum.  Smaller polyps both tubular adenomas.  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  Since discharge she is doing well.  No further bleeding.  CBC 8-24 with hemoglobin 10.1.    Past Medical History:  Diagnosis Date   Asthma    Pre-diabetes     Past Surgical History:  Procedure Laterality Date   COLONOSCOPY WITH PROPOFOL N/A 07/27/2022   Procedure: COLONOSCOPY WITH PROPOFOL;  Surgeon: Lanelle Bal, DO;  [facility]: [facility];  Service: Endoscopy;  Laterality: N/A;  915am, asa 3, does not want to move up   HEMOSTASIS CLIP PLACEMENT  07/27/2022   Procedure: HEMOSTASIS CLIP PLACEMENT;  Surgeon: Lanelle Bal, DO;  [facility]: [facility];  Service: Endoscopy;;   POLYPECTOMY  07/27/2022   Procedure: POLYPECTOMY INTESTINAL;  Surgeon: Lanelle Bal, DO;  [facility];  Service: Endoscopy;;   tubilagation  1981    Current Outpatient Medications  Medication Sig Dispense Refill   acetaminophen (TYLENOL) 325 MG tablet Take 2 tablets (650 mg total) by mouth every 6 (six) hours as needed for mild pain, fever or headache (or Fever >/= 101). 100 tablet 0   albuterol (VENTOLIN HFA)  108 (90 Base) MCG/ACT inhaler Inhale 2 puffs into the lungs every 6 (six) hours as needed for wheezing or shortness of breath. 18 g 2   amLODipine (NORVASC) 5 MG tablet Take 1 tablet (5 mg total) by mouth daily. 30 tablet 1   budesonide-formoterol (SYMBICORT) 80-4.5 MCG/ACT inhaler Inhale 2 puffs into the lungs 2 (two) times daily. 10.2 each 3   cholecalciferol (VITAMIN D3) 25 MCG (1000 UNIT) tablet Take 1,000 Units by mouth daily.     Omega-3 Fatty Acids (OMEGA 3 PO) Take 1 capsule by mouth daily.     pantoprazole (PROTONIX) 40 MG tablet Take 1 tablet (40 mg total) by mouth daily. 30 tablet 4   rosuvastatin (CRESTOR) 10 MG tablet Take 1 tablet (10 mg total) by mouth daily. 90 tablet 3   traZODone (DESYREL) 50 MG tablet Take 0.5-1 tablets (25-50 mg total) by mouth at bedtime as needed for sleep. 30 tablet 3   No current facility-administered medications for this visit.    Allergies as of 09/19/2022 - Review Complete 09/19/2022  Allergen Reaction Noted   Penicillins Anaphylaxis 02/23/2019   Sulfa antibiotics Itching 02/23/2019    Family History  Problem Relation Age of Onset   Diabetes type II Mother    Hypertension Mother    Cancer Sister    Hypertension Brother     Social History   Socioeconomic History   Marital status: Widowed    Spouse name: Not on file  Number of children: 3   Years of education: Not on file   Highest education level: 9th grade  Occupational History   Not on file  Tobacco Use   Smoking status: Every Day    Current packs/day: 1.00    Average packs/day: 1 pack/day for 45.0 years (45.0 ttl pk-yrs)    Types: Cigarettes   Smokeless tobacco: Never  Vaping Use   Vaping status: Never Used  Substance and Sexual Activity   Alcohol use: Yes    Alcohol/week: 14.0 standard drinks of alcohol    Types: 14 Cans of beer per week    Comment: daily   Drug use: Never   Sexual activity: Not Currently    Birth control/protection: Post-menopausal  Other Topics  Concern   Not on file  Social History Narrative   Not on file   Social Determinants of Health   Financial Resource Strain: Medium Risk (08/09/2022)   Overall Financial Resource Strain (CARDIA)    Difficulty of Paying Living Expenses: Somewhat hard  Food Insecurity: No Food Insecurity (08/09/2022)   Hunger Vital Sign    Worried About Running Out of Food in the Last Year: Never true    Ran Out of Food in the Last Year: Never true  Transportation Needs: No Transportation Needs (08/09/2022)   PRAPARE - Administrator, Civil Service (Medical): No    Lack of Transportation (Non-Medical): No  Physical Activity: Sufficiently Active (08/09/2022)   Exercise Vital Sign    Days of Exercise per Week: 5 days    Minutes of Exercise per Session: 30 min  Stress: No Stress Concern Present (08/09/2022)   [employer] of Occupational Health - Occupational Stress Questionnaire    Feeling of Stress : Only a little  Social Connections: Moderately Integrated (08/09/2022)   Social Connection and Isolation Panel [NHANES]    Frequency of Communication with Friends and Family: More than three times a week    Frequency of Social Gatherings with Friends and Family: More than three times a week    Attends Religious Services: More than 4 times per year    Active Member of [employer] or Organizations: Yes    Attends Banker Meetings: More than 4 times per year    Marital Status: Widowed    Subjective: Review of Systems  Constitutional:  Negative for chills and fever.  HENT:  Negative for congestion and hearing loss.   Eyes:  Negative for blurred vision and double vision.  Respiratory:  Negative for cough and shortness of breath.   Cardiovascular:  Negative for chest pain and palpitations.  Gastrointestinal:  Negative for abdominal pain, blood in stool, constipation, diarrhea, heartburn, melena and vomiting.  Genitourinary:  Negative for dysuria and urgency.  Musculoskeletal:  Negative  for joint pain and myalgias.  Skin:  Negative for itching and rash.  Neurological:  Negative for dizziness and headaches.  Psychiatric/Behavioral:  Negative for depression. The patient is not nervous/anxious.      Objective: BP (!) 147/91 (BP Location: Left Arm, Patient Position: Sitting, Cuff Size: Normal)   Pulse 75   Temp (!) 97.3 F (36.3 C) (Temporal)   Ht 5\' 2"  (1.575 m)   Wt 119 lb 11.2 oz (54.3 kg)   BMI 21.89 kg/m  Physical Exam Constitutional:      Appearance: Normal appearance.  HENT:     Head: Normocephalic and atraumatic.  Eyes:     Extraocular Movements: Extraocular movements intact.     Conjunctiva/sclera: Conjunctivae normal.  Cardiovascular:     Rate and Rhythm: Normal rate and regular rhythm.  Pulmonary:     Effort: Pulmonary effort is normal.     Breath sounds: Normal breath sounds.  Abdominal:     General: Bowel sounds are normal.     Palpations: Abdomen is soft.  Musculoskeletal:        General: No swelling. Normal range of motion.     Cervical back: Normal range of motion and neck supple.  Skin:    General: Skin is warm and dry.     Coloration: Skin is not jaundiced.  Neurological:     General: No focal deficit present.     Mental Status: She is alert and oriented to person, place, and time.  Psychiatric:        Mood and Affect: Mood normal.        Behavior: Behavior normal.      Assessment: *Tubulovillous adenoma of the colon *Iron efficiency anemia *Hypertension  Plan: Discussed patient's colonoscopy and path reports today.  Will schedule for colonoscopy to ensure complete resection of her very large polyp.  The risks including infection, bleed, or perforation as well as benefits, limitations, alternatives and imponderables have been reviewed with the patient. Questions have been answered. All parties agreeable.  Open improved.  Continue to monitor.  The patient was found to have elevated blood pressure when vital signs were checked  in the office. The blood pressure was rechecked by the nursing staff, and it was found be persistently elevated >140/90 mmHg. I personally advised the patient to follow up closely with the PCP for hypertension control.  09/19/2022 10:48 AM   Disclaimer: This note was dictated with voice recognition software. Similar sounding words can inadvertently be transcribed and may not be corrected upon review.

## 2022-09-24 ENCOUNTER — Encounter: Payer: Self-pay | Admitting: *Deleted

## 2022-09-25 ENCOUNTER — Other Ambulatory Visit: Payer: Self-pay | Admitting: Family Medicine

## 2022-09-25 ENCOUNTER — Encounter: Payer: Self-pay | Admitting: Family Medicine

## 2022-09-25 MED ORDER — FLUTICASONE-SALMETEROL 45-21 MCG/ACT IN AERO: 2.0000 | INHALATION_SPRAY | Freq: Two times a day (BID) | RESPIRATORY_TRACT | 12 refills | Status: DC

## 2022-09-26 ENCOUNTER — Telehealth: Payer: Self-pay | Admitting: *Deleted

## 2022-09-26 NOTE — Telephone Encounter (Signed)
Called and spoke to [place] (niece on [place]) to schedule pt for TCS. She states that she needs a Friday and can't do 9/20. Will wait until Oct. We will call once we get providers schedule

## 2022-09-28 ENCOUNTER — Ambulatory Visit (HOSPITAL_COMMUNITY)
Admission: RE | Admit: 2022-09-28 | Discharge: 2022-09-28 | Disposition: A | Discharge status: Home / Self Care | Payer: Medicare Other | Source: Ambulatory Visit | Attending: Family Medicine | Admitting: Family Medicine

## 2022-09-28 DIAGNOSIS — M810 Age-related osteoporosis without current pathological fracture: Secondary | ICD-10-CM | POA: Insufficient documentation

## 2022-09-30 ENCOUNTER — Other Ambulatory Visit: Payer: Self-pay | Admitting: Family Medicine

## 2022-09-30 MED ORDER — VITAMIN D3 25 MCG (1000 UT) PO CAPS: 1000.0000 [IU] | ORAL_CAPSULE | Freq: Every day | ORAL | 1 refills | Status: AC

## 2022-10-12 ENCOUNTER — Encounter: Payer: Self-pay | Admitting: Family Medicine

## 2022-10-12 ENCOUNTER — Ambulatory Visit: Payer: Medicare Other | Admitting: Family Medicine

## 2022-10-12 VITALS — BP 129/79 | HR 84 | Ht 62.0 in | Wt 118.0 lb

## 2022-10-12 DIAGNOSIS — Z23 Encounter for immunization: Secondary | ICD-10-CM | POA: Diagnosis not present

## 2022-10-12 DIAGNOSIS — I10 Essential (primary) hypertension: Secondary | ICD-10-CM

## 2022-10-12 MED ORDER — AMLODIPINE BESYLATE 5 MG PO TABS
5.0000 mg | ORAL_TABLET | Freq: Every day | ORAL | 3 refills | Status: DC
Start: 2022-10-12 — End: 2023-06-18

## 2022-10-12 MED ORDER — FLUTICASONE-SALMETEROL 45-21 MCG/ACT IN AERO: 2.0000 | INHALATION_SPRAY | Freq: Two times a day (BID) | RESPIRATORY_TRACT | 4 refills | Status: DC

## 2022-10-12 NOTE — Patient Instructions (Signed)

## 2022-10-12 NOTE — Assessment & Plan Note (Signed)
Vitals:   10/12/22 1005 10/12/22 1049  BP: 128/82 129/79  Blood pressure controlled in today's visit Continue Amlodipine 5 mg daily Continued discussion on DASH diet, low sodium diet and maintain a exercise routine for 150 minutes per week.

## 2022-10-12 NOTE — Progress Notes (Signed)
Patient Office Visit   Subjective   Patient ID: Kristine [name], female    DOB: October 30, 1955  Age: 67 y.o. MRN: [id number]  CC:  Chief Complaint  Patient presents with   Hypertension    F/u    Medication Refill    Refill on ADvair   Immunizations    Pneumonia vaccination request    [name] 67 year old female, presents to the clinic for HTN follow up. She  has a past medical history of Asthma, Hyperlipidemia, Hypertension, Insomnia, and Pre-diabetes.For the details of today's visit, please refer to assessment and plan.   HPI   Outpatient Encounter Medications as of 10/12/2022  Medication Sig   acetaminophen (TYLENOL) 325 MG tablet Take 2 tablets (650 mg total) by mouth every 6 (six) hours as needed for mild pain, fever or headache (or Fever >/= 101).   albuterol (VENTOLIN HFA) 108 (90 Base) MCG/ACT inhaler Inhale 2 puffs into the lungs every 6 (six) hours as needed for wheezing or shortness of breath.   Cholecalciferol (VITAMIN D3) 25 MCG (1000 UT) CAPS Take 1 capsule (1,000 Units total) by mouth daily.   Omega-3 Fatty Acids (OMEGA 3 PO) Take 1 capsule by mouth daily.   pantoprazole (PROTONIX) 40 MG tablet Take 1 tablet (40 mg total) by mouth daily.   rosuvastatin (CRESTOR) 10 MG tablet Take 1 tablet (10 mg total) by mouth daily.   [DISCONTINUED] amLODipine (NORVASC) 5 MG tablet Take 1 tablet (5 mg total) by mouth daily.   [DISCONTINUED] fluticasone-salmeterol (ADVAIR HFA) 45-21 MCG/ACT inhaler Inhale 2 puffs into the lungs 2 (two) times daily.   amLODipine (NORVASC) 5 MG tablet Take 1 tablet (5 mg total) by mouth daily.   fluticasone-salmeterol (ADVAIR HFA) 45-21 MCG/ACT inhaler Inhale 2 puffs into the lungs 2 (two) times daily.   traZODone (DESYREL) 50 MG tablet Take 0.5-1 tablets (25-50 mg total) by mouth at bedtime as needed for sleep. (Patient not taking: Reported on 10/12/2022)   No facility-administered encounter medications on file as of 10/12/2022.    Past  Surgical History:  Procedure Laterality Date   COLONOSCOPY WITH PROPOFOL N/A 07/27/2022   Procedure: COLONOSCOPY WITH PROPOFOL;  Surgeon: Lanelle Bal, DO;  [facility];  Service: Endoscopy;  Laterality: N/A;  915am, asa 3, does not want to move up   HEMOSTASIS CLIP PLACEMENT  07/27/2022   Procedure: HEMOSTASIS CLIP PLACEMENT;  Surgeon: Lanelle Bal, DO;  [facility];  Service: Endoscopy;;   POLYPECTOMY  07/27/2022   Procedure: POLYPECTOMY INTESTINAL;  Surgeon: Lanelle Bal, DO;  [facility];  Service: Endoscopy;;   tubilagation  1981    Review of Systems  Constitutional:  Negative for chills and fever.  Eyes:  Negative for blurred vision.  Respiratory:  Negative for shortness of breath.   Cardiovascular:  Negative for chest pain.  Neurological:  Negative for dizziness and headaches.      Objective    BP 129/79   Pulse 84   Ht 5\' 2"  (1.575 m)   Wt 118 lb (53.5 kg)   SpO2 95%   BMI 21.58 kg/m   Physical Exam Vitals reviewed.  Constitutional:      General: She is not in acute distress.    Appearance: Normal appearance. She is not ill-appearing, toxic-appearing or diaphoretic.  HENT:     Head: Normocephalic.  Eyes:     General:        Right eye:  No discharge.        Left eye: No discharge.     Conjunctiva/sclera: Conjunctivae normal.  Cardiovascular:     Rate and Rhythm: Normal rate.     Pulses: Normal pulses.     Heart sounds: Normal heart sounds.  Pulmonary:     Effort: Pulmonary effort is normal. No respiratory distress.     Breath sounds: Normal breath sounds.  Musculoskeletal:        General: Normal range of motion.     Cervical back: Normal range of motion.  Skin:    General: Skin is warm and dry.  Neurological:     General: No focal deficit present.     Mental Status: She is alert.     Coordination: Coordination normal.     Gait: Gait normal.  Psychiatric:        Mood and Affect: Mood normal.        Assessment & Plan:  Primary hypertension Assessment & Plan: Vitals:   10/12/22 1005 10/12/22 1049  BP: 128/82 129/79  Blood pressure controlled in today's visit Continue Amlodipine 5 mg daily Continued discussion on DASH diet, low sodium diet and maintain a exercise routine for 150 minutes per week.   Orders: -     amLODIPine Besylate; Take 1 tablet (5 mg total) by mouth daily.  Dispense: 30 tablet; Refill: 3  Other orders -     Fluticasone-Salmeterol; Inhale 2 puffs into the lungs 2 (two) times daily.  Dispense: 1 each; Refill: 4    Return in about 4 months (around 02/11/2023), or if symptoms worsen or fail to improve, for hyperlipidemia, pre-diabetes, hypertension.   Cruzita Lederer Newman Nip, FNP

## 2022-10-29 NOTE — Telephone Encounter (Signed)
Called and spoke with pt's niece [name] (on [place]) offered dates and she couldn't do them. She asked to schedule pt for November. Will call once we get providers schedule.

## 2022-11-12 NOTE — Telephone Encounter (Signed)
Called to schedule pt and spoke with [name], (pt's niece on [place]) and gave her dates for Nov. She says she will call back once she finds someone to be able to take pt to procedure.

## 2022-11-21 ENCOUNTER — Other Ambulatory Visit: Payer: Self-pay | Admitting: *Deleted

## 2022-11-21 ENCOUNTER — Encounter: Payer: Self-pay | Admitting: *Deleted

## 2022-11-21 MED ORDER — PEG 3350-KCL-NA BICARB-NACL 420 G PO SOLR: 4000.0000 mL | Freq: Once | ORAL | 0 refills | Status: AC

## 2022-11-21 NOTE — Telephone Encounter (Signed)
Spoke to pt's niece [name] (on [place]) and pt has been scheduled for 12/28/22. Instructions mailed and prep sent to the pharmacy.

## 2022-11-22 ENCOUNTER — Encounter: Payer: Self-pay | Admitting: *Deleted

## 2022-12-25 NOTE — Patient Instructions (Signed)
Kristine [name]  12/25/2022     @PREFPERIOPPHARMACY @   Your procedure is scheduled on  12/28/2022.   Report to [facility] at  0600 A.M.   Call this number if you have problems the morning of surgery:  [phone]  If you experience any cold or flu symptoms such as cough, fever, chills, shortness of breath, etc. between now and your scheduled surgery, please notify us at the above number.   Remember:  Follow the diet and prep instructions given to you by the office.   You may drink clear liquids until 0330 am on 12/28/2022.    Clear liquids allowed are:                    Water, Juice (No red color; non-citric and without pulp; diabetics please choose diet or no sugar options), Carbonated beverages (diabetics please choose diet or no sugar options), Clear Tea (No creamer, milk, or cream, including half & half and powdered creamer), Black Coffee Only (No creamer, milk or cream, including half & half and powdered creamer), and Clear Sports drink (No red color; diabetics please choose diet or no sugar options)    Take these medicines the morning of surgery with A SIP OF WATER                              amlodipine, pantoprazole.      Do not wear jewelry, make-up or nail polish, including gel polish,  artificial nails, or any other type of covering on natural nails (fingers and  toes).  Do not wear lotions, powders, or perfumes, or deodorant.  Do not shave 48 hours prior to surgery.  Men may shave face and neck.  Do not bring valuables to the hospital.  [facility] is not responsible for any belongings or valuables.  Contacts, dentures or bridgework may not be worn into surgery.  Leave your suitcase in the car.  After surgery it may be brought to your room.  For patients admitted to the hospital, discharge time will be determined by your treatment team.  Patients discharged the day of surgery will not be allowed to drive home and must have someone with them for 24  hours.    Special instructions:   DO NOT smoke tobacco or vape for 24 hours before your procedure.  Please read over the following fact sheets that you were given. Anesthesia Post-op Instructions and Care and Recovery After Surgery      Colonoscopy, Adult, Care After The following information offers guidance on how to care for yourself after your procedure. Your health care provider may also give you more specific instructions. If you have problems or questions, contact your health care provider. What can I expect after the procedure? After the procedure, it is common to have: A small amount of blood in your stool for 24 hours after the procedure. Some gas. Mild cramping or bloating of your abdomen. Follow these instructions at home: Eating and drinking  Drink enough fluid to keep your urine pale yellow. Follow instructions from your health care provider about eating or drinking restrictions. Resume your normal diet as told by your health care provider. Avoid heavy or fried foods that are hard to digest. Activity Rest as told by your health care provider. Avoid sitting for a long time without moving. Get up to take short walks every 1-2 hours. This is important  to improve blood flow and breathing. Ask for help if you feel weak or unsteady. Return to your normal activities as told by your health care provider. Ask your health care provider what activities are safe for you. Managing cramping and bloating  Try walking around when you have cramps or feel bloated. If directed, apply heat to your abdomen as told by your health care provider. Use the heat source that your health care provider recommends, such as a moist heat pack or a heating pad. Place a towel between your skin and the heat source. Leave the heat on for 20-30 minutes. Remove the heat if your skin turns bright red. This is especially important if you are unable to feel pain, heat, or cold. You have a greater risk of getting  burned. General instructions If you were given a sedative during the procedure, it can affect you for several hours. Do not drive or operate machinery until your health care provider says that it is safe. For the first 24 hours after the procedure: Do not sign important documents. Do not drink alcohol. Do your regular daily activities at a slower pace than normal. Eat soft foods that are easy to digest. Take over-the-counter and prescription medicines only as told by your health care provider. Keep all follow-up visits. This is important. Contact a health care provider if: You have blood in your stool 2-3 days after the procedure. Get help right away if: You have more than a small spotting of blood in your stool. You have large blood clots in your stool. You have swelling of your abdomen. You have nausea or vomiting. You have a fever. You have increasing pain in your abdomen that is not relieved with medicine. These symptoms may be an emergency. Get help right away. Call 911. Do not wait to see if the symptoms will go away. Do not drive yourself to the hospital. Summary After the procedure, it is common to have a small amount of blood in your stool. You may also have mild cramping and bloating of your abdomen. If you were given a sedative during the procedure, it can affect you for several hours. Do not drive or operate machinery until your health care provider says that it is safe. Get help right away if you have a lot of blood in your stool, nausea or vomiting, a fever, or increased pain in your abdomen. This information is not intended to replace advice given to you by your health care provider. Make sure you discuss any questions you have with your health care provider. Document Revised: 03/13/2022 Document Reviewed: 09/21/2020 Elsevier Patient Education  2024 [employer] Monitored Anesthesia Care, Care After The following information offers guidance on how to care for yourself  after your procedure. Your health care provider may also give you more specific instructions. If you have problems or questions, contact your health care provider. What can I expect after the procedure? After the procedure, it is common to have: Tiredness. Little or no memory about what happened during or after the procedure. Impaired judgment when it comes to making decisions. Nausea or vomiting. Some trouble with balance. Follow these instructions at home: For the time period you were told by your health care provider:  Rest. Do not participate in activities where you could fall or become injured. Do not drive or use machinery. Do not drink alcohol. Do not take sleeping pills or medicines that cause drowsiness. Do not make important decisions or sign legal documents. Do not  take care of children on your own. Medicines Take over-the-counter and prescription medicines only as told by your health care provider. If you were prescribed antibiotics, take them as told by your health care provider. Do not stop using the antibiotic even if you start to feel better. Eating and drinking Follow instructions from your health care provider about what you may eat and drink. Drink enough fluid to keep your urine pale yellow. If you vomit: Drink clear fluids slowly and in small amounts as you are able. Clear fluids include water, ice chips, low-calorie sports drinks, and fruit juice that has water added to it (diluted fruit juice). Eat light and bland foods in small amounts as you are able. These foods include bananas, applesauce, rice, lean meats, toast, and crackers. General instructions  Have a responsible adult stay with you for the time you are told. It is important to have someone help care for you until you are awake and alert. If you have sleep apnea, surgery and some medicines can increase your risk for breathing problems. Follow instructions from your health care provider about wearing your  sleep device: When you are sleeping. This includes during daytime naps. While taking prescription pain medicines, sleeping medicines, or medicines that make you drowsy. Do not use any products that contain nicotine or tobacco. These products include cigarettes, chewing tobacco, and vaping devices, such as e-cigarettes. If you need help quitting, ask your health care provider. Contact a health care provider if: You feel nauseous or vomit every time you eat or drink. You feel light-headed. You are still sleepy or having trouble with balance after 24 hours. You get a rash. You have a fever. You have redness or swelling around the IV site. Get help right away if: You have trouble breathing. You have new confusion after you get home. These symptoms may be an emergency. Get help right away. Call 911. Do not wait to see if the symptoms will go away. Do not drive yourself to the hospital. This information is not intended to replace advice given to you by your health care provider. Make sure you discuss any questions you have with your health care provider. Document Revised: 06/26/2021 Document Reviewed: 06/26/2021 Elsevier Patient Education  2024 [employer].

## 2022-12-26 ENCOUNTER — Encounter (HOSPITAL_COMMUNITY)
Admission: RE | Admit: 2022-12-26 | Discharge: 2022-12-26 | Disposition: A | Discharge status: Home / Self Care | Payer: Medicare Other | Source: Ambulatory Visit | Attending: Internal Medicine | Admitting: Internal Medicine

## 2022-12-26 ENCOUNTER — Encounter (HOSPITAL_COMMUNITY): Payer: Self-pay

## 2022-12-26 VITALS — BP 126/80 | HR 95 | Temp 97.8°F | Resp 18 | Ht 62.0 in | Wt 117.9 lb

## 2022-12-26 DIAGNOSIS — Z01812 Encounter for preprocedural laboratory examination: Secondary | ICD-10-CM | POA: Diagnosis present

## 2022-12-26 DIAGNOSIS — D62 Acute posthemorrhagic anemia: Secondary | ICD-10-CM

## 2022-12-26 DIAGNOSIS — F1010 Alcohol abuse, uncomplicated: Secondary | ICD-10-CM | POA: Diagnosis not present

## 2022-12-26 DIAGNOSIS — K922 Gastrointestinal hemorrhage, unspecified: Secondary | ICD-10-CM

## 2022-12-26 DIAGNOSIS — R7303 Prediabetes: Secondary | ICD-10-CM | POA: Diagnosis not present

## 2022-12-26 HISTORY — DX: Gastro-esophageal reflux disease without esophagitis: K21.9

## 2022-12-26 LAB — COMPREHENSIVE METABOLIC PANEL
ALT: 65 U/L — ABNORMAL HIGH (ref 0–44)
AST: 117 U/L — ABNORMAL HIGH (ref 15–41)
Albumin: 4.7 g/dL (ref 3.5–5.0)
Alkaline Phosphatase: 55 U/L (ref 38–126)
Anion gap: 15 (ref 5–15)
BUN: 14 mg/dL (ref 8–23)
CO2: 21 mmol/L — ABNORMAL LOW (ref 22–32)
Calcium: 10.3 mg/dL (ref 8.9–10.3)
Chloride: 100 mmol/L (ref 98–111)
Creatinine, Ser: 0.95 mg/dL (ref 0.44–1.00)
GFR, Estimated: >60 mL/min (ref >60)
Glucose, Bld: 84 mg/dL (ref 70–99)
Potassium: 3.7 mmol/L (ref 3.5–5.1)
Sodium: 136 mmol/L (ref 135–145)
Total Bilirubin: 1.7 mg/dL — ABNORMAL HIGH (ref <1.2)
Total Protein: 8.8 g/dL — ABNORMAL HIGH (ref 6.5–8.1)

## 2022-12-26 LAB — CBC WITH DIFFERENTIAL/PLATELET
Abs Immature Granulocytes: 0.02 10*3/uL (ref 0.00–0.07)
Basophils Absolute: 0 10*3/uL (ref 0.0–0.1)
Basophils Relative: 0 %
Eosinophils Absolute: 0 10*3/uL (ref 0.0–0.5)
Eosinophils Relative: 0 %
HCT: 32.4 % — ABNORMAL LOW (ref 36.0–46.0)
Hemoglobin: 11 g/dL — ABNORMAL LOW (ref 12.0–15.0)
Immature Granulocytes: 0 %
Lymphocytes Relative: 23 %
Lymphs Abs: 1.6 10*3/uL (ref 0.7–4.0)
MCH: 30.5 pg (ref 26.0–34.0)
MCHC: 34 g/dL (ref 30.0–36.0)
MCV: 89.8 fL (ref 80.0–100.0)
Monocytes Absolute: 0.5 10*3/uL (ref 0.1–1.0)
Monocytes Relative: 7 %
Neutro Abs: 4.7 10*3/uL (ref 1.7–7.7)
Neutrophils Relative %: 70 %
Platelets: 229 10*3/uL (ref 150–400)
RBC: 3.61 MIL/uL — ABNORMAL LOW (ref 3.87–5.11)
RDW: 14.5 % (ref 11.5–15.5)
WBC: 6.8 10*3/uL (ref 4.0–10.5)
nRBC: 0 % (ref 0.0–0.2)

## 2022-12-27 NOTE — Anesthesia Preprocedure Evaluation (Addendum)
Anesthesia Evaluation  Patient identified by MRN, date of birth, ID band Patient awake    Reviewed: Allergy & Precautions, H&P , NPO status , Patient's Chart, lab work & pertinent test results  Airway Mallampati: III  TM Distance: >3 FB Neck ROM: Full  Mouth opening: Limited Mouth Opening  Dental  (+) Missing, Chipped, Dental Advisory Given, Poor Dentition   Pulmonary asthma , Current Smoker and Patient abstained from smoking.   Pulmonary exam normal breath sounds clear to auscultation       Cardiovascular hypertension, Normal cardiovascular exam Rhythm:Regular Rate:Normal     Neuro/Psych negative neurological ROS  negative psych ROS   GI/Hepatic negative GI ROS,,,(+)     substance abuse  alcohol use  Endo/Other  negative endocrine ROS    Renal/GU negative Renal ROS  negative genitourinary   Musculoskeletal negative musculoskeletal ROS (+)    Abdominal   Peds negative pediatric ROS (+)  Hematology negative hematology ROS (+)   Anesthesia Other Findings   Reproductive/Obstetrics negative OB ROS                              Anesthesia Physical Anesthesia Plan  ASA: 2  Anesthesia Plan: General   Post-op Pain Management: Minimal or no pain anticipated   Induction: Intravenous  PONV Risk Score and Plan: 1 and Propofol infusion  Airway Management Planned: Nasal Cannula and Natural Airway  Additional Equipment:   Intra-op Plan:   Post-operative Plan:   Informed Consent: I have reviewed the patients History and Physical, chart, labs and discussed the procedure including the risks, benefits and alternatives for the proposed anesthesia with the patient or authorized representative who has indicated his/her understanding and acceptance.     Dental advisory given  Plan Discussed with: CRNA and Surgeon  Anesthesia Plan Comments:         Anesthesia Quick Evaluation

## 2022-12-28 ENCOUNTER — Encounter (HOSPITAL_COMMUNITY): Payer: Self-pay

## 2022-12-28 ENCOUNTER — Ambulatory Visit (HOSPITAL_COMMUNITY): Payer: Medicare Other | Admitting: Certified Registered Nurse Anesthetist

## 2022-12-28 ENCOUNTER — Encounter (HOSPITAL_COMMUNITY)
Admission: RE | Disposition: A | Discharge status: Home / Self Care | Payer: Self-pay | Source: Home / Self Care | Attending: Internal Medicine

## 2022-12-28 ENCOUNTER — Ambulatory Visit (HOSPITAL_COMMUNITY)
Admission: RE | Admit: 2022-12-28 | Discharge: 2022-12-28 | Disposition: A | Discharge status: Home / Self Care | Payer: Medicare Other | Attending: Internal Medicine | Admitting: Internal Medicine

## 2022-12-28 ENCOUNTER — Other Ambulatory Visit: Payer: Self-pay

## 2022-12-28 DIAGNOSIS — K635 Polyp of colon: Secondary | ICD-10-CM

## 2022-12-28 DIAGNOSIS — Z5986 Financial insecurity: Secondary | ICD-10-CM | POA: Insufficient documentation

## 2022-12-28 DIAGNOSIS — D126 Benign neoplasm of colon, unspecified: Secondary | ICD-10-CM | POA: Diagnosis not present

## 2022-12-28 DIAGNOSIS — J45909 Unspecified asthma, uncomplicated: Secondary | ICD-10-CM | POA: Diagnosis not present

## 2022-12-28 DIAGNOSIS — R7303 Prediabetes: Secondary | ICD-10-CM

## 2022-12-28 DIAGNOSIS — D125 Benign neoplasm of sigmoid colon: Secondary | ICD-10-CM | POA: Insufficient documentation

## 2022-12-28 DIAGNOSIS — D122 Benign neoplasm of ascending colon: Secondary | ICD-10-CM | POA: Insufficient documentation

## 2022-12-28 DIAGNOSIS — D123 Benign neoplasm of transverse colon: Secondary | ICD-10-CM | POA: Insufficient documentation

## 2022-12-28 DIAGNOSIS — I10 Essential (primary) hypertension: Secondary | ICD-10-CM | POA: Insufficient documentation

## 2022-12-28 DIAGNOSIS — Z9889 Other specified postprocedural states: Secondary | ICD-10-CM

## 2022-12-28 DIAGNOSIS — Z1211 Encounter for screening for malignant neoplasm of colon: Secondary | ICD-10-CM | POA: Diagnosis not present

## 2022-12-28 DIAGNOSIS — F17210 Nicotine dependence, cigarettes, uncomplicated: Secondary | ICD-10-CM | POA: Diagnosis not present

## 2022-12-28 DIAGNOSIS — K648 Other hemorrhoids: Secondary | ICD-10-CM | POA: Diagnosis not present

## 2022-12-28 DIAGNOSIS — Z860101 Personal history of adenomatous and serrated colon polyps: Secondary | ICD-10-CM

## 2022-12-28 HISTORY — PX: POLYPECTOMY: SHX149

## 2022-12-28 HISTORY — PX: COLONOSCOPY WITH PROPOFOL: SHX5780

## 2022-12-28 HISTORY — PX: BIOPSY: SHX5522

## 2022-12-28 SURGERY — COLONOSCOPY WITH PROPOFOL
Anesthesia: General

## 2022-12-28 MED ORDER — STERILE WATER FOR IRRIGATION IR SOLN
Status: DC | PRN
  Administered 2022-12-28: 60 mL

## 2022-12-28 MED ORDER — PROPOFOL 500 MG/50ML IV EMUL
INTRAVENOUS | Status: AC
  Filled 2022-12-28: qty 50

## 2022-12-28 MED ORDER — PROPOFOL 500 MG/50ML IV EMUL
INTRAVENOUS | Status: DC | PRN
  Administered 2022-12-28: 60 ug/kg/min via INTRAVENOUS

## 2022-12-28 MED ORDER — LACTATED RINGERS IV SOLN: INTRAVENOUS | Status: DC | PRN

## 2022-12-28 NOTE — Anesthesia Postprocedure Evaluation (Signed)
Anesthesia Post Note  Patient: Kristine [name]  Procedure(s) Performed: COLONOSCOPY WITH PROPOFOL POLYPECTOMY INTESTINAL BIOPSY  Patient location during evaluation: PACU Anesthesia Type: General Level of consciousness: awake and alert Pain management: pain level controlled Vital Signs Assessment: post-procedure vital signs reviewed and stable Respiratory status: spontaneous breathing, nonlabored ventilation, respiratory function stable and patient connected to nasal cannula oxygen Cardiovascular status: blood pressure returned to baseline and stable Postop Assessment: [name] apparent nausea or vomiting Anesthetic complications: [name]   There were [name] known notable events for this encounter.   Last Vitals:  Vitals:   12/28/22 0635 12/28/22 0818  BP: 120/82 112/64  Pulse: 83 85  Resp: 20 18  Temp: 37 C 36.8 C  SpO2: 100% 100%    Last Pain:  Vitals:   12/28/22 0818  TempSrc: Axillary  PainSc: 0-[name] pain                 Abhijot Straughter L Yochanan Eddleman

## 2022-12-28 NOTE — Transfer of Care (Signed)
Immediate Anesthesia Transfer of Care Note  Patient: Kristine [name]  Procedure(s) Performed: COLONOSCOPY WITH PROPOFOL POLYPECTOMY INTESTINAL BIOPSY  Patient Location: Short Stay  Anesthesia Type:General  Level of Consciousness: awake, alert , and oriented  Airway & Oxygen Therapy: Patient Spontanous Breathing  Post-op Assessment: Report given to RN, Post -op Vital signs reviewed and stable, Patient moving all extremities X 4, and Patient able to stick tongue midline  Post vital signs: Reviewed and stable  Last Vitals:  Vitals Value Taken Time  BP 138/96   Temp 98.3   Pulse 87   Resp 17   SpO2 100     Last Pain:  Vitals:   12/28/22 0737  TempSrc:   PainSc: 0-[name] pain         Complications: [name] notable events documented.

## 2022-12-28 NOTE — Op Note (Addendum)
[facility] Patient Name: Kristine [name] Procedure Date: 12/28/2022 7:14 AM MRN: [id number] Date of Birth: 20-Jan-1956 Attending MD: Hennie Duos. Marletta Lor , [place], [id number] CSN: [id number] Age: 67 Admit Type: Outpatient Procedure:                Colonoscopy Indications:              High risk colon cancer surveillance: Personal                            history of adenoma (10 mm or greater in size), High                            risk colon cancer surveillance: Personal history of                            adenoma with villous component Providers:                Hennie Duos. Marletta Lor, DO, Crystal Page, Francoise Ceo                            RN, RN, Barkley Bruns L. Jessee Avers, Technician Referring MD:              Medicines:                See the Anesthesia note for documentation of the                            administered medications Complications:            No immediate complications. Estimated Blood Loss:     Estimated blood loss was minimal. Procedure:                Pre-Anesthesia Assessment:                           - The anesthesia plan was to use monitored                            anesthesia care (MAC).                           After obtaining informed consent, the colonoscope                            was passed under direct vision. Throughout the                            procedure, the patient's blood pressure, pulse, and                            oxygen saturations were monitored continuously. The                            [id number]-HQ190L ([id number]) scope was introduced through  the anus and advanced to the the cecum, identified                            by appendiceal orifice and ileocecal valve. The                            colonoscopy was performed without difficulty. The                            patient tolerated the procedure well. The quality                            of the bowel preparation was evaluated using the                             BBPS [facility] Bowel Preparation Scale) with scores                            of: Right Colon = 3, Transverse Colon = 3 and Left                            Colon = 3 (entire mucosa seen well with no residual                            staining, small fragments of stool or opaque                            liquid). The total BBPS score equals 9. Scope In: 7:41:22 AM Scope Out: 8:12:11 AM Scope Withdrawal Time: 0 hours 25 minutes 24 seconds  Total Procedure Duration: 0 hours 30 minutes 49 seconds  Findings:      Non-bleeding internal hemorrhoids were found during endoscopy.      A 5 mm polyp was found in the ascending colon. The polyp was sessile.       The polyp was removed with a cold snare. Resection and retrieval were       complete.      A 2 mm polyp was found in the ascending colon. The polyp was sessile.       The polyp was removed with a cold snare. Resection and retrieval were       complete.      A post polypectomy scar was found in the proximal ascending colon. There       was ?residual polypoid tissue. Tissue removed with cold snare.      Three pedunculated and sessile polyps were found in the transverse       colon. The polyps were 4 to 10 mm in size. These polyps were removed       with a cold snare. Resection and retrieval were complete.      A 5 mm polyp was found in the sigmoid colon. The polyp was sessile. The       polyp was removed with a cold snare. Resection and retrieval were       complete. Impression:               - Non-bleeding internal hemorrhoids.                           -  One 5 mm polyp in the ascending colon, removed                            with a cold snare. Resected and retrieved.                           - One 2 mm polyp in the ascending colon, removed                            with a cold snare. Resected and retrieved.                           - Post-polypectomy scar in the proximal ascending                            colon.                            - Three 4 to 10 mm polyps in the transverse colon,                            removed with a cold snare. Resected and retrieved.                           - One 5 mm polyp in the sigmoid colon, removed with                            a cold snare. Resected and retrieved. Moderate Sedation:      Per Anesthesia Care Recommendation:           - Patient has a contact number available for                            emergencies. The signs and symptoms of potential                            delayed complications were discussed with the                            patient. Return to normal activities tomorrow.                            Written discharge instructions were provided to the                            patient.                           - Resume previous diet.                           - Continue present medications.                           - Await pathology results.                           -  Repeat colonoscopy in 3 years for surveillance.                           - Return to GI clinic PRN. Procedure Code(s):        --- Professional ---                           [phone], Colonoscopy, flexible; with removal of                            tumor(s), polyp(s), or other lesion(s) by snare                            technique Diagnosis Code(s):        --- Professional ---                           D12.2, Benign neoplasm of ascending colon                           D12.5, Benign neoplasm of sigmoid colon                           Z98.890, Other specified postprocedural states                           D12.3, Benign neoplasm of transverse colon (hepatic                            flexure or splenic flexure)                           K64.8, Other hemorrhoids                           Z86.010, Personal history of colonic polyps CPT copyright 2022 American Medical Association. All rights reserved. The codes documented in this report are preliminary and upon coder review may   be revised to meet current compliance requirements. Hennie Duos. Marletta Lor, DO Hennie Duos. Marletta Lor, DO 12/28/2022 8:19:12 AM This report has been signed electronically. Number of Addenda: 0
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
## 2022-12-28 NOTE — Discharge Instructions (Addendum)
  Colonoscopy Discharge Instructions  Read the instructions outlined below and refer to this sheet in the next few weeks. These discharge instructions provide you with general information on caring for yourself after you leave the hospital. Your doctor may also give you specific instructions. While your treatment has been planned according to the most current medical practices available, unavoidable complications occasionally occur.   ACTIVITY You may resume your regular activity, but move at a slower pace for the next 24 hours.  Take frequent rest periods for the next 24 hours.  Walking will help get rid of the air and reduce the bloated feeling in your belly (abdomen).  No driving for 24 hours (because of the medicine (anesthesia) used during the test).   Do not sign any important legal documents or operate any machinery for 24 hours (because of the anesthesia used during the test).  NUTRITION Drink plenty of fluids.  You may resume your normal diet as instructed by your doctor.  Begin with a light meal and progress to your normal diet. Heavy or fried foods are harder to digest and may make you feel sick to your stomach (nauseated).  Avoid alcoholic beverages for 24 hours or as instructed.  MEDICATIONS You may resume your normal medications unless your doctor tells you otherwise.  WHAT YOU CAN EXPECT TODAY Some feelings of bloating in the abdomen.  Passage of more gas than usual.  Spotting of blood in your stool or on the toilet paper.  IF YOU HAD POLYPS REMOVED DURING THE COLONOSCOPY: No aspirin products for 7 days or as instructed.  No alcohol for 7 days or as instructed.  Eat a soft diet for the next 24 hours.  FINDING OUT THE RESULTS OF YOUR TEST Not all test results are available during your visit. If your test results are not back during the visit, make an appointment with your caregiver to find out the results. Do not assume everything is normal if you have not heard from your  caregiver or the medical facility. It is important for you to follow up on all of your test results.  SEEK IMMEDIATE MEDICAL ATTENTION IF: You have more than a spotting of blood in your stool.  Your belly is swollen (abdominal distention).  You are nauseated or vomiting.  You have a temperature over 101.  You have abdominal pain or discomfort that is severe or gets worse throughout the day.   Your colonoscopy revealed 6 small polyp(s) which I removed successfully. Await pathology results, my office will contact you. I recommend repeating colonoscopy in 3 years for surveillance purposes.   Previous polypectomy site from large polyp prior looked good. I took samples of this area.   Follow up with GI as needed  I hope you have a great rest of your week!  Hennie Duos. Marletta Lor, D.O. Gastroenterology and Hepatology [facility]

## 2023-01-01 LAB — SURGICAL PATHOLOGY

## 2023-01-03 ENCOUNTER — Encounter (HOSPITAL_COMMUNITY): Payer: Self-pay | Admitting: Internal Medicine

## 2023-02-15 ENCOUNTER — Ambulatory Visit: Payer: Medicare Other | Admitting: Family Medicine

## 2023-02-28 NOTE — Patient Instructions (Signed)

## 2023-02-28 NOTE — Progress Notes (Signed)
Established Patient Office Visit   Subjective  Patient ID: Kristine [name], female    DOB: 06/01/1955  Age: 68 y.o. MRN: [id number]  Chief Complaint  Patient presents with   Follow-up    HTN    She  has a past medical history of Asthma, GERD (gastroesophageal reflux disease), Hyperlipidemia, Hypertension, Insomnia, and Pre-diabetes.  HPI Patient presents to the clinic for chronic follow up. For the details of today's visit, please refer to assessment and plan.   Review of Systems  Constitutional:  Negative for chills and fever.  Respiratory:  Negative for shortness of breath.   Cardiovascular:  Negative for chest pain.  Gastrointestinal:  Negative for abdominal pain.  Neurological:  Negative for dizziness and headaches.      Objective:     BP 130/78   Pulse 77   Ht 5\' 2"  (1.575 m)   Wt 115 lb 1.3 oz (52.2 kg)   SpO2 97%   BMI 21.05 kg/m  BP Readings from Last 3 Encounters:  03/01/23 130/78  12/28/22 112/64  12/26/22 126/80      Physical Exam Vitals reviewed.  Constitutional:      General: She is not in acute distress.    Appearance: Normal appearance. She is not ill-appearing, toxic-appearing or diaphoretic.  HENT:     Head: Normocephalic.  Eyes:     General:        Right eye: No discharge.        Left eye: No discharge.     Conjunctiva/sclera: Conjunctivae normal.  Cardiovascular:     Rate and Rhythm: Normal rate.     Pulses: Normal pulses.     Heart sounds: Normal heart sounds.  Pulmonary:     Effort: Pulmonary effort is normal. No respiratory distress.     Breath sounds: Normal breath sounds.  Abdominal:     General: Bowel sounds are normal.     Palpations: Abdomen is soft.     Tenderness: There is no abdominal tenderness. There is no right CVA tenderness, left CVA tenderness or guarding.  Skin:    General: Skin is warm and dry.     Capillary Refill: Capillary refill takes less than 2 seconds.  Neurological:     Mental Status: She is alert.   Psychiatric:        Mood and Affect: Mood normal.        Behavior: Behavior normal.      No results found for any visits on 03/01/23.  The 10-year ASCVD risk score (Arnett DK, et al., 2019) is: 20.4%    Assessment & Plan:  Primary hypertension Assessment & Plan: Vitals:   03/01/23 1311  BP: 130/78  Controlled on Amlodipine 5 mg daily Labs ordered. Discussed with  patient to monitor their blood pressure regularly and maintain a heart-healthy diet rich in fruits, vegetables, whole grains, and low-fat dairy, while reducing sodium intake to less than 2,300 mg per day. Regular physical activity, such as 30 minutes of moderate exercise most days of the week, will help lower blood pressure and improve overall cardiovascular health. Avoiding smoking, limiting alcohol consumption, and managing stress. Take  prescribed medication, & take it as directed and avoid skipping doses. Seek emergency care if your blood pressure is (over 180/100) or you experience chest pain, shortness of breath, or sudden vision changes.Patient verbalizes understanding regarding plan of care and all questions answered.   Orders: -     CMP14+EGFR -     CBC with Differential/Platelet -  Lipid panel -     TSH + free T4  Primary insomnia Assessment & Plan: Trial on Gabapentin 300 mg at bedtime Explained to go to bed at the same time each night and get up at the same time each morning, including on the weekends. Make sure your bedroom is quiet, dark, relaxing, and at a comfortable temperature. Remove electronic devices, such as TVs, computers, and smart phones, from the bedroom.    Other orders -     Gabapentin; Take 1 capsule (300 mg total) by mouth at bedtime as needed.  Dispense: 30 capsule; Refill: 3    Return in about 4 months (around 06/29/2023), or if symptoms worsen or fail to improve, for chronic follow-up.   Cruzita Lederer Newman Nip, FNP

## 2023-03-01 ENCOUNTER — Encounter: Payer: Self-pay | Admitting: Family Medicine

## 2023-03-01 ENCOUNTER — Ambulatory Visit (INDEPENDENT_AMBULATORY_CARE_PROVIDER_SITE_OTHER): Payer: Medicare Other | Admitting: Family Medicine

## 2023-03-01 VITALS — BP 130/78 | HR 77 | Ht 62.0 in | Wt 115.1 lb

## 2023-03-01 DIAGNOSIS — F5101 Primary insomnia: Secondary | ICD-10-CM | POA: Diagnosis not present

## 2023-03-01 DIAGNOSIS — I10 Essential (primary) hypertension: Secondary | ICD-10-CM

## 2023-03-01 MED ORDER — GABAPENTIN 300 MG PO CAPS: 300.0000 mg | ORAL_CAPSULE | Freq: Every evening | ORAL | 3 refills | Status: AC | PRN

## 2023-03-01 NOTE — Assessment & Plan Note (Addendum)
Vitals:   03/01/23 1311  BP: 130/78  Controlled on Amlodipine 5 mg daily Labs ordered. Discussed with  patient to monitor their blood pressure regularly and maintain a heart-healthy diet rich in fruits, vegetables, whole grains, and low-fat dairy, while reducing sodium intake to less than 2,300 mg per day. Regular physical activity, such as 30 minutes of moderate exercise most days of the week, will help lower blood pressure and improve overall cardiovascular health. Avoiding smoking, limiting alcohol consumption, and managing stress. Take  prescribed medication, & take it as directed and avoid skipping doses. Seek emergency care if your blood pressure is (over 180/100) or you experience chest pain, shortness of breath, or sudden vision changes.Patient verbalizes understanding regarding plan of care and all questions answered.

## 2023-03-01 NOTE — Assessment & Plan Note (Signed)
Trial on Gabapentin 300 mg at bedtime Explained to go to bed at the same time each night and get up at the same time each morning, including on the weekends. Make sure your bedroom is quiet, dark, relaxing, and at a comfortable temperature. Remove electronic devices, such as TVs, computers, and smart phones, from the bedroom.

## 2023-03-02 LAB — LIPID PANEL
Chol/HDL Ratio: 2.2 {ratio} (ref 0.0–4.4)
Cholesterol, Total: 194 mg/dL (ref 100–199)
HDL: 88 mg/dL (ref >39)
LDL Chol Calc (NIH): 94 mg/dL (ref 0–99)
Triglycerides: 63 mg/dL (ref 0–149)
VLDL Cholesterol Cal: 12 mg/dL (ref 5–40)

## 2023-03-02 LAB — CBC WITH DIFFERENTIAL/PLATELET
Basophils Absolute: 0 10*3/uL (ref 0.0–0.2)
Basos: 1 %
EOS (ABSOLUTE): 0 10*3/uL (ref 0.0–0.4)
Eos: 1 %
Hematocrit: 33.7 % — ABNORMAL LOW (ref 34.0–46.6)
Hemoglobin: 10.9 g/dL — ABNORMAL LOW (ref 11.1–15.9)
Immature Grans (Abs): 0 10*3/uL (ref 0.0–0.1)
Immature Granulocytes: 0 %
Lymphocytes Absolute: 2.7 10*3/uL (ref 0.7–3.1)
Lymphs: 40 %
MCH: 30.9 pg (ref 26.6–33.0)
MCHC: 32.3 g/dL (ref 31.5–35.7)
MCV: 96 fL (ref 79–97)
Monocytes Absolute: 0.7 10*3/uL (ref 0.1–0.9)
Monocytes: 10 %
Neutrophils Absolute: 3.3 10*3/uL (ref 1.4–7.0)
Neutrophils: 48 %
Platelets: 305 10*3/uL (ref 150–450)
RBC: 3.53 x10E6/uL — ABNORMAL LOW (ref 3.77–5.28)
RDW: 14 % (ref 11.7–15.4)
WBC: 6.6 10*3/uL (ref 3.4–10.8)

## 2023-03-02 LAB — CMP14+EGFR
ALT: 38 [IU]/L — ABNORMAL HIGH (ref 0–32)
AST: 49 [IU]/L — ABNORMAL HIGH (ref 0–40)
Albumin: 4.6 g/dL (ref 3.9–4.9)
Alkaline Phosphatase: 69 [IU]/L (ref 44–121)
BUN/Creatinine Ratio: 13 (ref 12–28)
BUN: 15 mg/dL (ref 8–27)
Bilirubin Total: 0.7 mg/dL (ref 0.0–1.2)
CO2: 21 mmol/L (ref 20–29)
Calcium: 10.6 mg/dL — ABNORMAL HIGH (ref 8.7–10.3)
Chloride: 99 mmol/L (ref 96–106)
Creatinine, Ser: 1.13 mg/dL — ABNORMAL HIGH (ref 0.57–1.00)
Globulin, Total: 3.4 g/dL (ref 1.5–4.5)
Glucose: 90 mg/dL (ref 70–99)
Potassium: 3.9 mmol/L (ref 3.5–5.2)
Sodium: 138 mmol/L (ref 134–144)
Total Protein: 8 g/dL (ref 6.0–8.5)
eGFR: 54 mL/min/{1.73_m2} — ABNORMAL LOW (ref >59)

## 2023-03-02 LAB — TSH+FREE T4
Free T4: 1.01 ng/dL (ref 0.82–1.77)
TSH: 1.09 u[IU]/mL (ref 0.450–4.500)

## 2023-03-05 ENCOUNTER — Encounter: Payer: Self-pay | Admitting: Family Medicine

## 2023-04-12 ENCOUNTER — Encounter: Payer: Self-pay | Admitting: Family Medicine

## 2023-06-18 ENCOUNTER — Other Ambulatory Visit: Payer: Self-pay | Admitting: Family Medicine

## 2023-06-18 DIAGNOSIS — I10 Essential (primary) hypertension: Secondary | ICD-10-CM

## 2023-06-21 ENCOUNTER — Encounter (HOSPITAL_COMMUNITY): Payer: Self-pay

## 2023-06-21 ENCOUNTER — Other Ambulatory Visit: Payer: Self-pay | Admitting: Family Medicine

## 2023-06-21 MED ORDER — PANTOPRAZOLE SODIUM 40 MG PO TBEC: 40.0000 mg | DELAYED_RELEASE_TABLET | Freq: Every day | ORAL | 4 refills | Status: DC

## 2023-06-28 ENCOUNTER — Encounter: Payer: Self-pay | Admitting: Family Medicine

## 2023-06-28 ENCOUNTER — Ambulatory Visit: Payer: Medicare Other | Admitting: Family Medicine

## 2023-06-28 VITALS — BP 137/82 | HR 76 | Ht 62.0 in | Wt 121.0 lb

## 2023-06-28 DIAGNOSIS — E559 Vitamin D deficiency, unspecified: Secondary | ICD-10-CM

## 2023-06-28 DIAGNOSIS — I10 Essential (primary) hypertension: Secondary | ICD-10-CM

## 2023-06-28 DIAGNOSIS — D509 Iron deficiency anemia, unspecified: Secondary | ICD-10-CM | POA: Diagnosis not present

## 2023-06-28 DIAGNOSIS — R7303 Prediabetes: Secondary | ICD-10-CM | POA: Diagnosis not present

## 2023-06-28 MED ORDER — AMLODIPINE BESYLATE 5 MG PO TABS: 5.0000 mg | ORAL_TABLET | Freq: Every day | ORAL | 0 refills | Status: AC

## 2023-06-28 MED ORDER — BENZONATATE 200 MG PO CAPS: 200.0000 mg | ORAL_CAPSULE | Freq: Two times a day (BID) | ORAL | 2 refills | Status: AC | PRN

## 2023-06-28 MED ORDER — PANTOPRAZOLE SODIUM 40 MG PO TBEC: 40.0000 mg | DELAYED_RELEASE_TABLET | Freq: Every day | ORAL | 4 refills | Status: AC

## 2023-06-28 NOTE — Progress Notes (Signed)
 Established Patient Office Visit   Subjective  Patient ID: Kristine [name], female    DOB: March 22, 1955  Age: 68 y.o. MRN: [id number]  Chief Complaint  Patient presents with   Medical Management of Chronic Issues    Tickle in throat causing cough x 7 days    She  has a past medical history of Asthma, GERD (gastroesophageal reflux disease), Hyperlipidemia, Hypertension, Insomnia, and Pre-diabetes.  HPI Patient presents to the clinic for chronic follow up. For the details of today's visit, please refer to assessment and plan.  Review of Systems  Constitutional:  Negative for chills and fever.  Eyes:  Negative for pain.  Respiratory:  Negative for shortness of breath.   Cardiovascular:  Negative for chest pain.  Gastrointestinal:  Negative for abdominal pain.  Genitourinary:  Negative for dysuria.  Neurological:  Negative for dizziness and headaches.      Objective:     BP 137/82   Pulse 76   Ht 5\' 2"  (1.575 m)   Wt 121 lb 0.6 oz (54.9 kg)   SpO2 93%   BMI 22.14 kg/m  BP Readings from Last 3 Encounters:  06/28/23 137/82  03/01/23 130/78  12/28/22 112/64      Physical Exam Vitals reviewed.  Constitutional:      General: She is not in acute distress.    Appearance: Normal appearance. She is not ill-appearing, toxic-appearing or diaphoretic.  HENT:     Head: Normocephalic.  Eyes:     General:        Right eye: No discharge.        Left eye: No discharge.     Conjunctiva/sclera: Conjunctivae normal.  Cardiovascular:     Rate and Rhythm: Normal rate.     Pulses: Normal pulses.     Heart sounds: Normal heart sounds.  Pulmonary:     Effort: Pulmonary effort is normal. No respiratory distress.     Breath sounds: Normal breath sounds.  Abdominal:     General: Bowel sounds are normal.     Palpations: Abdomen is soft.     Tenderness: There is no abdominal tenderness. There is no right CVA tenderness, left CVA tenderness or guarding.  Musculoskeletal:         General: Normal range of motion.  Skin:    General: Skin is warm and dry.  Neurological:     Mental Status: She is alert.  Psychiatric:        Mood and Affect: Mood normal.        Behavior: Behavior normal.      No results found for any visits on 06/28/23.  The 10-year ASCVD risk score (Arnett DK, et al., 2019) is: 17.8%    Assessment & Plan:  Primary hypertension Assessment & Plan: Continue  Amlodipine  5 mg daily Labs ordered. Discussed with  patient to monitor their blood pressure regularly and maintain a heart-healthy diet rich in fruits, vegetables, whole grains, and low-fat dairy, while reducing sodium intake to less than 2,300 mg per day. Regular physical activity, such as 30 minutes of moderate exercise most days of the week, will help lower blood pressure and improve overall cardiovascular health. Avoiding smoking, limiting alcohol consumption, and managing stress. Take  prescribed medication, & take it as directed and avoid skipping doses. Seek emergency care if your blood pressure is (over 180/100) or you experience chest pain, shortness of breath, or sudden vision changes.Patient verbalizes understanding regarding plan of care and all questions answered.  Orders: -  CBC with Differential/Platelet -     amLODIPine  Besylate; Take 1 tablet (5 mg total) by mouth daily.  Dispense: 90 tablet; Refill: 0 -     CMP14+EGFR  Prediabetes -     Hemoglobin A1c  Vitamin D  deficiency -     VITAMIN D  25 Hydroxy (Vit-D Deficiency, Fractures)  Iron deficiency anemia, unspecified iron deficiency anemia type -     Iron, TIBC and Ferritin Panel -     B12 and Folate Panel  Other orders -     Benzonatate; Take 1 capsule (200 mg total) by mouth 2 (two) times daily as needed for cough.  Dispense: 20 capsule; Refill: 2 -     Pantoprazole  Sodium; Take 1 tablet (40 mg total) by mouth daily.  Dispense: 30 tablet; Refill: 4    Return in about 6 months (around 12/29/2023), or if symptoms  worsen or fail to improve, for chronic follow-up.   Avelino Lek Amber Bail, FNP

## 2023-06-28 NOTE — Patient Instructions (Signed)

## 2023-06-28 NOTE — Assessment & Plan Note (Signed)
 Continue  Amlodipine  5 mg daily Labs ordered. Discussed with  patient to monitor their blood pressure regularly and maintain a heart-healthy diet rich in fruits, vegetables, whole grains, and low-fat dairy, while reducing sodium intake to less than 2,300 mg per day. Regular physical activity, such as 30 minutes of moderate exercise most days of the week, will help lower blood pressure and improve overall cardiovascular health. Avoiding smoking, limiting alcohol consumption, and managing stress. Take  prescribed medication, & take it as directed and avoid skipping doses. Seek emergency care if your blood pressure is (over 180/100) or you experience chest pain, shortness of breath, or sudden vision changes.Patient verbalizes understanding regarding plan of care and all questions answered.

## 2023-06-29 LAB — IRON,TIBC AND FERRITIN PANEL
Ferritin: 121 ng/mL (ref 15–150)
Iron Saturation: 24 % (ref 15–55)
Iron: 92 ug/dL (ref 27–139)
Total Iron Binding Capacity: 378 ug/dL (ref 250–450)
UIBC: 286 ug/dL (ref 118–369)

## 2023-06-29 LAB — CMP14+EGFR
ALT: 28 IU/L (ref 0–32)
AST: 39 IU/L (ref 0–40)
Albumin: 4.9 g/dL (ref 3.9–4.9)
Alkaline Phosphatase: 80 IU/L (ref 44–121)
BUN/Creatinine Ratio: 17 (ref 12–28)
BUN: 17 mg/dL (ref 8–27)
Bilirubin Total: 0.9 mg/dL (ref 0.0–1.2)
CO2: 24 mmol/L (ref 20–29)
Calcium: 10.7 mg/dL — ABNORMAL HIGH (ref 8.7–10.3)
Chloride: 100 mmol/L (ref 96–106)
Creatinine, Ser: 0.98 mg/dL (ref 0.57–1.00)
Globulin, Total: 3.6 g/dL (ref 1.5–4.5)
Glucose: 91 mg/dL (ref 70–99)
Potassium: 4 mmol/L (ref 3.5–5.2)
Sodium: 139 mmol/L (ref 134–144)
Total Protein: 8.5 g/dL (ref 6.0–8.5)
eGFR: 64 mL/min/{1.73_m2} (ref >59)

## 2023-06-29 LAB — VITAMIN D 25 HYDROXY (VIT D DEFICIENCY, FRACTURES): Vit D, 25-Hydroxy: 50.7 ng/mL (ref 30.0–100.0)

## 2023-06-29 LAB — CBC WITH DIFFERENTIAL/PLATELET
Basophils Absolute: 0 10*3/uL (ref 0.0–0.2)
Basos: 1 %
EOS (ABSOLUTE): 0 10*3/uL (ref 0.0–0.4)
Eos: 1 %
Hematocrit: 34.9 % (ref 34.0–46.6)
Hemoglobin: 11.3 g/dL (ref 11.1–15.9)
Immature Grans (Abs): 0 10*3/uL (ref 0.0–0.1)
Immature Granulocytes: 0 %
Lymphocytes Absolute: 2 10*3/uL (ref 0.7–3.1)
Lymphs: 35 %
MCH: 29.4 pg (ref 26.6–33.0)
MCHC: 32.4 g/dL (ref 31.5–35.7)
MCV: 91 fL (ref 79–97)
Monocytes Absolute: 0.6 10*3/uL (ref 0.1–0.9)
Monocytes: 11 %
Neutrophils Absolute: 3.1 10*3/uL (ref 1.4–7.0)
Neutrophils: 52 %
Platelets: 289 10*3/uL (ref 150–450)
RBC: 3.84 x10E6/uL (ref 3.77–5.28)
RDW: 13.5 % (ref 11.7–15.4)
WBC: 5.8 10*3/uL (ref 3.4–10.8)

## 2023-06-29 LAB — HEMOGLOBIN A1C
Est. average glucose Bld gHb Est-mCnc: 120 mg/dL
Hgb A1c MFr Bld: 5.8 % — ABNORMAL HIGH (ref 4.8–5.6)

## 2023-06-29 LAB — B12 AND FOLATE PANEL
Folate: 11.3 ng/mL (ref >3.0)
Vitamin B-12: 422 pg/mL (ref 232–1245)

## 2023-07-01 ENCOUNTER — Ambulatory Visit: Payer: Self-pay | Admitting: Family Medicine

## 2023-07-02 NOTE — Telephone Encounter (Signed)
 Your calcium  level is currently 10.7, which is slightly above the normal range, and was 10.6 back in January. This is considered a borderline elevation and is not a cause for concern at this time. We will continue to monitor it during your routine labs, but no specific action is needed right now

## 2023-11-12 ENCOUNTER — Other Ambulatory Visit: Payer: Self-pay | Admitting: Family Medicine

## 2023-11-12 DIAGNOSIS — E785 Hyperlipidemia, unspecified: Secondary | ICD-10-CM

## 2023-12-24 ENCOUNTER — Ambulatory Visit (INDEPENDENT_AMBULATORY_CARE_PROVIDER_SITE_OTHER)

## 2023-12-24 VITALS — BP 128/64 | HR 70 | Ht 62.0 in | Wt 120.0 lb

## 2023-12-24 DIAGNOSIS — Z0001 Encounter for general adult medical examination with abnormal findings: Secondary | ICD-10-CM

## 2023-12-24 DIAGNOSIS — Z1231 Encounter for screening mammogram for malignant neoplasm of breast: Secondary | ICD-10-CM | POA: Diagnosis not present

## 2023-12-24 DIAGNOSIS — F17210 Nicotine dependence, cigarettes, uncomplicated: Secondary | ICD-10-CM | POA: Diagnosis not present

## 2023-12-24 DIAGNOSIS — Z0000 Encounter for general adult medical examination without abnormal findings: Secondary | ICD-10-CM

## 2023-12-24 NOTE — Patient Instructions (Addendum)
 Kristine [name],  Thank you for taking the time for your Medicare Wellness Visit. I appreciate your continued commitment to your health goals. Please review the care plan we discussed, and feel free to reach out if I can assist you further.  Please note that Annual Wellness Visits do not include a physical exam. Some assessments may be limited, especially if the visit was conducted virtually. If needed, we may recommend an in-person follow-up with your provider.  Ongoing Care Seeing your primary care provider every 3 to 6 months helps us  monitor your health and provide consistent, personalized care.   Referrals If a referral was made during today's visit and you haven't received any updates within two weeks, please contact the referred provider directly to check on the status.  Mammogram at [facility] Call [phone] to schedule your screening No perfumes, lotions, or deodorants the day of your screening. You can schedule your mammogram through mychart!   Lung Cancer Screening-[place] Office [address]-First Floor Medical Building directly across from [facility] Phone [phone]   Recommended Screenings:  Health Maintenance  Topic Date Due   Medicare Annual Wellness Visit  Never done   Zoster (Shingles) Vaccine (1 of 2) Never done   Flu Shot  09/13/2023   COVID-19 Vaccine (1 - 2025-26 season) Never done   Screening for Lung Cancer  02/29/2024*   Pneumococcal Vaccine for age over 58 (1 of 2 - PCV) 02/29/2024*   Breast Cancer Screening  02/29/2024   DTaP/Tdap/Td vaccine (2 - Td or Tdap) 05/31/2032   Colon Cancer Screening  12/27/2032   DEXA scan (bone density measurement)  Completed   Hepatitis C Screening  Completed   Meningitis B Vaccine  Aged Out  *Topic was postponed. The date shown is not the original due date.       12/28/2022    6:31 AM  Advanced Directives  Does Patient Have a Medical Advance Directive? No  Would patient like information on  creating a medical advance directive? No - Patient declined    Vision: Annual vision screenings are recommended for early detection of glaucoma, cataracts, and diabetic retinopathy. These exams can also reveal signs of chronic conditions such as diabetes and high blood pressure.  Dental: Annual dental screenings help detect early signs of oral cancer, gum disease, and other conditions linked to overall health, including heart disease and diabetes.  Please see the attached documents for additional preventive care recommendations.

## 2023-12-24 NOTE — Progress Notes (Unsigned)
 Subjective:   Kristine [name] is a 68 y.o. female who presents for a Medicare Annual Wellness Visit.  Allergies (verified) Penicillins and Sulfa antibiotics   History: Past Medical History:  Diagnosis Date   Asthma    GERD (gastroesophageal reflux disease)    Hyperlipidemia    Hypertension    Insomnia    Pre-diabetes    Past Surgical History:  Procedure Laterality Date   BIOPSY  12/28/2022   Procedure: BIOPSY;  Surgeon: Cindie Carlin POUR, DO;  Location: [facility];  Service: Endoscopy;;   COLONOSCOPY WITH PROPOFOL  N/A 07/27/2022   Procedure: COLONOSCOPY WITH PROPOFOL ;  Surgeon: Cindie Carlin POUR, DO;  Location: [facility];  Service: Endoscopy;  Laterality: N/A;  915am, asa 3, does not want to move up   COLONOSCOPY WITH PROPOFOL  N/A 12/28/2022   Procedure: COLONOSCOPY WITH PROPOFOL ;  Surgeon: Cindie Carlin POUR, DO;  Location: [facility];  Service: Endoscopy;  Laterality: N/A;  7:30 am, asa 3   HEMOSTASIS CLIP PLACEMENT  07/27/2022   Procedure: HEMOSTASIS CLIP PLACEMENT;  Surgeon: Cindie Carlin POUR, DO;  Location: [facility];  Service: Endoscopy;;   POLYPECTOMY  07/27/2022   Procedure: POLYPECTOMY INTESTINAL;  Surgeon: Cindie Carlin POUR, DO;  Location: [facility];  Service: Endoscopy;;   POLYPECTOMY  12/28/2022   Procedure: POLYPECTOMY INTESTINAL;  Surgeon: Cindie Carlin POUR, DO;  Location: [facility];  Service: Endoscopy;;   tubilagation  1981   Family History  Problem Relation Age of Onset   Diabetes type II Mother    Hypertension Mother    Cancer Sister    Hypertension Brother    Social History   Occupational History   Not on file  Tobacco Use   Smoking status: Every Day    Current packs/day: 0.50    Average packs/day: 1 pack/day for 46.4 years (45.7 ttl pk-yrs)    Types: Cigarettes    Start date: 08/13/2022   Smokeless tobacco: Never  Vaping Use   Vaping status: Never Used  Substance and Sexual Activity   Alcohol use: Yes    Alcohol/week: 6.0  standard drinks of alcohol    Types: 6 Cans of beer per week    Comment: daily   Drug use: Never   Sexual activity: Not Currently    Birth control/protection: Post-menopausal   Tobacco Counseling Ready to quit: No Counseling given: Yes  SDOH Screenings   Food Insecurity: No Food Insecurity (12/18/2023)  Housing: Low Risk  (12/18/2023)  Transportation Needs: No Transportation Needs (12/18/2023)  Utilities: Not At Risk (07/27/2022)  Alcohol Screen: Low Risk  (12/18/2023)  Depression (PHQ2-9): Low Risk  (06/28/2023)  Financial Resource Strain: Low Risk  (12/18/2023)  Physical Activity: Sufficiently Active (12/18/2023)  Social Connections: Socially Isolated (12/18/2023)  Stress: No Stress Concern Present (12/18/2023)  Tobacco Use: High Risk (12/24/2023)   Depression Screen    06/28/2023    2:50 PM 10/12/2022   10:09 AM 08/10/2022   11:05 AM 07/20/2022    9:06 AM 06/01/2022   10:54 AM 06/01/2022   10:04 AM 02/19/2022    3:20 PM  PHQ 2/9 Scores  PHQ - 2 Score 0 0 0 0 0 0 0  PHQ- 9 Score 3  1  0  1  3  0  4      Data saved with a previous flowsheet row definition     Goals Addressed               This  Visit's Progress     I want to go fishing (pt-stated)         Functional Status Activities of Daily Living (to include ambulation/medication): (Patient-Rptd) Independent Ambulation: (Patient-Rptd) Independent Home Management: (Patient-Rptd) Independent  Fall Screening Falls in the past year?: (Patient-Rptd) 0 Number of falls in past year: (Patient-Rptd) 0 Was there an injury with Fall?: (Patient-Rptd) 0 Fall Risk Category Calculator: (Patient-Rptd) 0 Patient Fall Risk Level: (Patient-Rptd) Low Fall Risk  Fall Risk Patient at Risk for Falls Due to: No Fall Risks; Other (Comment) (age) Fall risk Follow up: Falls evaluation completed  Advance Directives (For Healthcare) Does Patient Have a Medical Advance Directive?: No Would patient like information on creating a medical  advance directive?: No - Patient declined        Objective:    Today's Vitals   12/24/23 1056  BP: 128/64  Pulse: 70  Weight: 120 lb (54.4 kg)  Height: 5' 2 (1.575 m)   Body mass index is 21.95 kg/m.  Current Medications (verified) Outpatient Encounter Medications as of 12/24/2023  Medication Sig   acetaminophen  (TYLENOL ) 325 MG tablet Take 2 tablets (650 mg total) by mouth every 6 (six) hours as needed for mild pain, fever or headache (or Fever >/= 101).   albuterol  (VENTOLIN  HFA) 108 (90 Base) MCG/ACT inhaler Inhale 2 puffs into the lungs every 6 (six) hours as needed for wheezing or shortness of breath.   amLODipine  (NORVASC ) 5 MG tablet Take 1 tablet (5 mg total) by mouth daily.   benzonatate  (TESSALON ) 200 MG capsule Take 1 capsule (200 mg total) by mouth 2 (two) times daily as needed for cough.   Cholecalciferol (VITAMIN D3) 25 MCG (1000 UT) CAPS Take 1 capsule (1,000 Units total) by mouth daily.   fluticasone -salmeterol (ADVAIR HFA) 45-21 MCG/ACT inhaler Inhale 2 puffs into the lungs 2 (two) times daily.   gabapentin  (NEURONTIN ) 300 MG capsule Take 1 capsule (300 mg total) by mouth at bedtime as needed.   Omega-3 Fatty Acids (OMEGA 3 PO) Take 1 capsule by mouth daily.   pantoprazole  (PROTONIX ) 40 MG tablet Take 1 tablet (40 mg total) by mouth daily.   rosuvastatin  (CRESTOR ) 10 MG tablet Take 1 tablet by mouth once daily   traZODone  (DESYREL ) 50 MG tablet Take 0.5-1 tablets (25-50 mg total) by mouth at bedtime as needed for sleep.   No facility-administered encounter medications on file as of 12/24/2023.   Hearing/Vision screen Hearing Screening - Comments:: Patient denies any hearing difficulties.   Vision Screening - Comments:: Patient does not have an eye doctor. A list of eye doctors has been provided to the patient.   Immunizations and Health Maintenance Health Maintenance  Topic Date Due   Medicare Annual Wellness (AWV)  Never done   Zoster Vaccines- Shingrix  (1 of 2) Never done   Mammogram  03/01/2023   Influenza Vaccine  09/13/2023   COVID-19 Vaccine (1 - 2025-26 season) Never done   Lung Cancer Screening  02/29/2024 (Originally 08/09/2007)   Pneumococcal Vaccine: 50+ Years (1 of 2 - PCV) 02/29/2024 (Originally 08/08/1976)   DEXA SCAN  09/27/2024   Colonoscopy  12/27/2025   DTaP/Tdap/Td (2 - Td or Tdap) 05/31/2032   Hepatitis C Screening  Completed   Meningococcal B Vaccine  Aged Out        Assessment/Plan:  This is a routine wellness examination for [facility].  Patient Care Team: Del Wilhelmena Falter, Hilario, FNP as PCP - General (Family Medicine)  I have personally reviewed  and noted the following in the patient's chart:   Medical and social history Use of alcohol, tobacco or illicit drugs  Current medications and supplements including opioid prescriptions. Functional ability and status Nutritional status Physical activity Advanced directives List of other physicians Hospitalizations, surgeries, and ER visits in previous 12 months Vitals Screenings to include cognitive, depression, and falls Referrals and appointments  Orders Placed This Encounter  Procedures   MM 3D SCREENING MAMMOGRAM BILATERAL BREAST    Standing Status:   Future    Expiration Date:   12/23/2024    Reason for Exam (SYMPTOM  OR DIAGNOSIS REQUIRED):   breast cancer screening    Preferred imaging location?:   [facility]   Ambulatory Referral for Lung Cancer Scre    Referral Priority:   Routine    Referral Type:   Consultation    Referral Reason:   Specialty Services Required    Number of Visits Requested:   1   In addition, I have reviewed and discussed with patient certain preventive protocols, quality metrics, and best practice recommendations. A written personalized care plan for preventive services as well as general preventive health recommendations were provided to patient.   Etha Stambaugh, CMA   12/24/2023   Return On December 28, 2024 at 10:40  am for your Medicare Wellness Visit.  After Visit Summary: {CHL AMB AWV After Visit Summary:[phone]}  Nurse Notes: ***

## 2024-01-03 ENCOUNTER — Ambulatory Visit

## 2024-01-03 VITALS — BP 123/77 | HR 88 | Ht 62.0 in | Wt 119.0 lb

## 2024-01-03 DIAGNOSIS — E559 Vitamin D deficiency, unspecified: Secondary | ICD-10-CM | POA: Diagnosis not present

## 2024-01-03 DIAGNOSIS — R7303 Prediabetes: Secondary | ICD-10-CM

## 2024-01-03 DIAGNOSIS — D509 Iron deficiency anemia, unspecified: Secondary | ICD-10-CM

## 2024-01-03 DIAGNOSIS — I10 Essential (primary) hypertension: Secondary | ICD-10-CM | POA: Diagnosis not present

## 2024-01-03 DIAGNOSIS — E0849 Diabetes mellitus due to underlying condition with other diabetic neurological complication: Secondary | ICD-10-CM | POA: Insufficient documentation

## 2024-01-03 NOTE — Progress Notes (Unsigned)
 Established Patient Office Visit  Subjective   Patient ID: Kristine [name], female    DOB: 10/19/1955  Age: 68 y.o. MRN: [id number]  Chief Complaint  Patient presents with   Medical Management of Chronic Issues    6 month follow up     HPI Discussed the use of AI scribe software for clinical note transcription with the patient, who gave verbal consent to proceed.  History of Present Illness    Kristine [name] is a 68 year old female who presents for a six-month follow-up visit. She is accompanied by her niece, Rosina.  Glycemic status - Prediabetes without recent diagnosis of diabetes - Consumed a honey bun and sweet milk this morning - Currently taking Crestor   Gastrointestinal symptoms - No persistent constipation - Occasional constipation relieved with Miralax , which she prefers for its gentle effect and ability to be mixed with coffee without affecting taste - No stomach issues  Immunization status - Has not received a flu shot - Expresses dislike for injections     Patient Active Problem List   Diagnosis Date Noted   Prediabetes 01/06/2024   Vitamin D  deficiency 01/06/2024   Iron deficiency anemia 01/06/2024   ABLA (acute blood loss anemia) 07/28/2022   Acute GI bleeding 07/27/2022   HTN (hypertension) 07/27/2022   Colon polyp 07/27/2022   Tobacco abuse 07/27/2022   Alcohol abuse 07/27/2022   Encounter for Papanicolaou smear of cervix 07/20/2022   Insomnia 06/01/2022   Asthma 02/20/2022    ROS    Objective:     BP 123/77   Pulse 88   Ht 5' 2 (1.575 m)   Wt 119 lb 0.6 oz (54 kg)   SpO2 95%   BMI 21.77 kg/m  BP Readings from Last 3 Encounters:  01/03/24 123/77  12/24/23 128/64  06/28/23 137/82   Wt Readings from Last 3 Encounters:  01/03/24 119 lb 0.6 oz (54 kg)  12/24/23 120 lb (54.4 kg)  06/28/23 121 lb 0.6 oz (54.9 kg)     Physical Exam Vitals and nursing note reviewed.  Constitutional:      Appearance: Normal appearance.  HENT:      Head: Normocephalic.     Right Ear: Tympanic membrane, ear canal and external ear normal.     Left Ear: Tympanic membrane, ear canal and external ear normal.     Nose: Nose normal.     Mouth/Throat:     Mouth: Mucous membranes are moist.     Pharynx: Oropharynx is clear.  Eyes:     Extraocular Movements: Extraocular movements intact.     Pupils: Pupils are equal, round, and reactive to light.  Cardiovascular:     Rate and Rhythm: Normal rate and regular rhythm.  Pulmonary:     Effort: Pulmonary effort is normal.     Breath sounds: Normal breath sounds.  Musculoskeletal:     Cervical back: Normal range of motion and neck supple.  Skin:    General: Skin is warm and dry.  Neurological:     Mental Status: She is alert and oriented to person, place, and time.  Psychiatric:        Mood and Affect: Mood normal.        Thought Content: Thought content normal.       Last CBC Lab Results  Component Value Date   WBC 5.0 01/03/2024   HGB 11.4 01/03/2024   HCT 35.7 01/03/2024   MCV 93 01/03/2024   MCH 29.5 01/03/2024  RDW 13.8 01/03/2024   PLT 258 01/03/2024   Last metabolic panel Lab Results  Component Value Date   GLUCOSE 101 (H) 01/03/2024   NA 137 01/03/2024   K 3.7 01/03/2024   CL 97 01/03/2024   CO2 26 01/03/2024   BUN 16 01/03/2024   CREATININE 1.25 (H) 01/03/2024   EGFR 48 (L) 01/03/2024   CALCIUM  11.2 (H) 01/03/2024   PROT 8.0 01/03/2024   ALBUMIN 4.5 01/03/2024   LABGLOB 3.5 01/03/2024   AGRATIO 1.3 06/01/2022   BILITOT 1.3 (H) 01/03/2024   ALKPHOS 77 01/03/2024   AST 59 (H) 01/03/2024   ALT 31 01/03/2024   ANIONGAP 15 12/26/2022   Last lipids Lab Results  Component Value Date   CHOL 194 03/01/2023   HDL 88 03/01/2023   LDLCALC 94 03/01/2023   TRIG 63 03/01/2023   CHOLHDL 2.2 03/01/2023   Last hemoglobin A1c Lab Results  Component Value Date   HGBA1C 5.7 (H) 01/03/2024   Last thyroid functions Lab Results  Component Value Date   TSH 1.090  03/01/2023   FREET4 1.01 03/01/2023   Last vitamin D  Lab Results  Component Value Date   VD25OH 50.3 01/03/2024   Last vitamin B12 and Folate Lab Results  Component Value Date   VITAMINB12 462 01/03/2024   FOLATE 13.0 01/03/2024      The 10-year ASCVD risk score (Arnett DK, et al., 2019) is: 15.3%    Assessment & Plan:   Problem List Items Addressed This Visit       Cardiovascular and Mediastinum   HTN (hypertension) - Primary   Blood pressure is well-controlled.  No medication changes made today      Relevant Orders   CMP14+EGFR (Completed)   CBC with Differential/Platelet (Completed)     Other   Prediabetes   No new symptoms reported. - Ordered blood sugar and A1c tests.      Relevant Orders   CMP14+EGFR (Completed)   Hemoglobin A1C (Completed)   Vitamin D  (25 hydroxy) (Completed)   CBC with Differential/Platelet (Completed)   Vitamin D  deficiency   Vitamin D  levels to be re-evaluated. - Ordered vitamin D  level test.      Relevant Orders   Vitamin D  (25 hydroxy) (Completed)   Iron deficiency anemia   Recheck labs to reevaluate      Relevant Orders   B12 and Folate Panel (Completed)   Fe+TIBC+Fer (Completed)   CBC with Differential/Platelet (Completed)    No follow-ups on file.    Leita Longs, FNP

## 2024-01-04 LAB — IRON,TIBC AND FERRITIN PANEL
Ferritin: 225 ng/mL — ABNORMAL HIGH (ref 15–150)
Iron Saturation: 24 % (ref 15–55)
Iron: 80 ug/dL (ref 27–139)
Total Iron Binding Capacity: 328 ug/dL (ref 250–450)
UIBC: 248 ug/dL (ref 118–369)

## 2024-01-04 LAB — CMP14+EGFR
ALT: 31 IU/L (ref 0–32)
AST: 59 IU/L — ABNORMAL HIGH (ref 0–40)
Albumin: 4.5 g/dL (ref 3.9–4.9)
Alkaline Phosphatase: 77 IU/L (ref 49–135)
BUN/Creatinine Ratio: 13 (ref 12–28)
BUN: 16 mg/dL (ref 8–27)
Bilirubin Total: 1.3 mg/dL — ABNORMAL HIGH (ref 0.0–1.2)
CO2: 26 mmol/L (ref 20–29)
Calcium: 11.2 mg/dL — ABNORMAL HIGH (ref 8.7–10.3)
Chloride: 97 mmol/L (ref 96–106)
Creatinine, Ser: 1.25 mg/dL — ABNORMAL HIGH (ref 0.57–1.00)
Globulin, Total: 3.5 g/dL (ref 1.5–4.5)
Glucose: 101 mg/dL — ABNORMAL HIGH (ref 70–99)
Potassium: 3.7 mmol/L (ref 3.5–5.2)
Sodium: 137 mmol/L (ref 134–144)
Total Protein: 8 g/dL (ref 6.0–8.5)
eGFR: 48 mL/min/1.73 — ABNORMAL LOW (ref >59)

## 2024-01-04 LAB — B12 AND FOLATE PANEL
Folate: 13 ng/mL (ref >3.0)
Vitamin B-12: 462 pg/mL (ref 232–1245)

## 2024-01-04 LAB — CBC WITH DIFFERENTIAL/PLATELET
Basophils Absolute: 0 x10E3/uL (ref 0.0–0.2)
Basos: 0 %
EOS (ABSOLUTE): 0 x10E3/uL (ref 0.0–0.4)
Eos: 1 %
Hematocrit: 35.7 % (ref 34.0–46.6)
Hemoglobin: 11.4 g/dL (ref 11.1–15.9)
Immature Grans (Abs): 0 x10E3/uL (ref 0.0–0.1)
Immature Granulocytes: 0 %
Lymphocytes Absolute: 1.7 x10E3/uL (ref 0.7–3.1)
Lymphs: 35 %
MCH: 29.5 pg (ref 26.6–33.0)
MCHC: 31.9 g/dL (ref 31.5–35.7)
MCV: 93 fL (ref 79–97)
Monocytes Absolute: 0.6 x10E3/uL (ref 0.1–0.9)
Monocytes: 12 %
Neutrophils Absolute: 2.6 x10E3/uL (ref 1.4–7.0)
Neutrophils: 52 %
Platelets: 258 x10E3/uL (ref 150–450)
RBC: 3.86 x10E6/uL (ref 3.77–5.28)
RDW: 13.8 % (ref 11.7–15.4)
WBC: 5 x10E3/uL (ref 3.4–10.8)

## 2024-01-04 LAB — VITAMIN D 25 HYDROXY (VIT D DEFICIENCY, FRACTURES): Vit D, 25-Hydroxy: 50.3 ng/mL (ref 30.0–100.0)

## 2024-01-04 LAB — HEMOGLOBIN A1C
Est. average glucose Bld gHb Est-mCnc: 117 mg/dL
Hgb A1c MFr Bld: 5.7 % — ABNORMAL HIGH (ref 4.8–5.6)

## 2024-01-06 ENCOUNTER — Ambulatory Visit: Payer: Self-pay

## 2024-01-06 ENCOUNTER — Other Ambulatory Visit: Payer: Self-pay

## 2024-01-06 DIAGNOSIS — R7303 Prediabetes: Secondary | ICD-10-CM | POA: Insufficient documentation

## 2024-01-06 DIAGNOSIS — E559 Vitamin D deficiency, unspecified: Secondary | ICD-10-CM | POA: Insufficient documentation

## 2024-01-06 DIAGNOSIS — D509 Iron deficiency anemia, unspecified: Secondary | ICD-10-CM | POA: Insufficient documentation

## 2024-01-06 NOTE — Assessment & Plan Note (Signed)
 Vitamin D  levels to be re-evaluated. - Ordered vitamin D  level test.

## 2024-01-06 NOTE — Assessment & Plan Note (Signed)
 No new symptoms reported. - Ordered blood sugar and A1c tests.

## 2024-01-06 NOTE — Assessment & Plan Note (Signed)
 Recheck labs to reevaluate

## 2024-01-06 NOTE — Assessment & Plan Note (Signed)
 Blood pressure is well-controlled.  No medication changes made today

## 2024-01-27 ENCOUNTER — Ambulatory Visit: Admitting: Internal Medicine

## 2024-02-10 ENCOUNTER — Other Ambulatory Visit: Payer: Self-pay | Admitting: Family Medicine

## 2024-03-02 ENCOUNTER — Encounter (HOSPITAL_COMMUNITY): Payer: Self-pay

## 2024-03-02 ENCOUNTER — Ambulatory Visit (HOSPITAL_COMMUNITY): Admission: RE | Admit: 2024-03-02 | Discharge: 2024-03-02 | Disposition: A | Source: Ambulatory Visit

## 2024-03-02 DIAGNOSIS — Z1231 Encounter for screening mammogram for malignant neoplasm of breast: Secondary | ICD-10-CM | POA: Insufficient documentation

## 2024-03-09 ENCOUNTER — Ambulatory Visit: Admitting: Internal Medicine

## 2024-04-06 ENCOUNTER — Ambulatory Visit: Admitting: Internal Medicine

## 2024-12-28 ENCOUNTER — Ambulatory Visit
